# Patient Record
Sex: Female | Born: 1970 | Race: White | Hispanic: No | Marital: Married | Smoking: Current some day smoker
Health system: Southern US, Community
[De-identification: ages and names within clinical notes are randomized; demographics above are authoritative.]

## PROBLEM LIST (undated history)

## (undated) DIAGNOSIS — F419 Anxiety disorder, unspecified: Secondary | ICD-10-CM

## (undated) DIAGNOSIS — F32A Depression, unspecified: Secondary | ICD-10-CM

## (undated) DIAGNOSIS — F329 Major depressive disorder, single episode, unspecified: Secondary | ICD-10-CM

## (undated) HISTORY — DX: Major depressive disorder, single episode, unspecified: F32.9

## (undated) HISTORY — DX: Depression, unspecified: F32.A

## (undated) HISTORY — DX: Anxiety disorder, unspecified: F41.9

---

## 1999-06-24 ENCOUNTER — Inpatient Hospital Stay (HOSPITAL_COMMUNITY): Admission: AD | Admit: 1999-06-24 | Discharge: 1999-07-17 | Payer: Self-pay | Admitting: Obstetrics and Gynecology

## 1999-06-24 ENCOUNTER — Encounter: Payer: Self-pay | Admitting: Obstetrics and Gynecology

## 1999-06-25 ENCOUNTER — Encounter: Payer: Self-pay | Admitting: Obstetrics and Gynecology

## 1999-06-29 ENCOUNTER — Encounter: Payer: Self-pay | Admitting: Obstetrics and Gynecology

## 1999-07-03 ENCOUNTER — Encounter: Payer: Self-pay | Admitting: Obstetrics and Gynecology

## 1999-07-08 ENCOUNTER — Encounter: Payer: Self-pay | Admitting: Obstetrics and Gynecology

## 1999-08-24 ENCOUNTER — Inpatient Hospital Stay (HOSPITAL_COMMUNITY): Admission: AD | Admit: 1999-08-24 | Discharge: 1999-08-24 | Payer: Self-pay | Admitting: *Deleted

## 1999-08-24 ENCOUNTER — Encounter: Payer: Self-pay | Admitting: Obstetrics and Gynecology

## 1999-08-28 ENCOUNTER — Encounter (INDEPENDENT_AMBULATORY_CARE_PROVIDER_SITE_OTHER): Payer: Self-pay

## 1999-08-29 ENCOUNTER — Inpatient Hospital Stay (HOSPITAL_COMMUNITY): Admission: AD | Admit: 1999-08-29 | Discharge: 1999-09-01 | Payer: Self-pay | Admitting: Obstetrics & Gynecology

## 1999-09-02 ENCOUNTER — Encounter (HOSPITAL_COMMUNITY): Admission: RE | Admit: 1999-09-02 | Discharge: 1999-10-03 | Payer: Self-pay | Admitting: Obstetrics & Gynecology

## 1999-10-02 ENCOUNTER — Other Ambulatory Visit: Admission: RE | Admit: 1999-10-02 | Discharge: 1999-10-02 | Payer: Self-pay | Admitting: Obstetrics & Gynecology

## 2000-09-18 ENCOUNTER — Other Ambulatory Visit: Admission: RE | Admit: 2000-09-18 | Discharge: 2000-09-18 | Payer: Self-pay | Admitting: Obstetrics & Gynecology

## 2001-09-23 ENCOUNTER — Other Ambulatory Visit: Admission: RE | Admit: 2001-09-23 | Discharge: 2001-09-23 | Payer: Self-pay | Admitting: Obstetrics & Gynecology

## 2002-09-21 ENCOUNTER — Other Ambulatory Visit: Admission: RE | Admit: 2002-09-21 | Discharge: 2002-09-21 | Payer: Self-pay | Admitting: Gynecology

## 2003-07-28 ENCOUNTER — Ambulatory Visit (HOSPITAL_COMMUNITY): Admission: RE | Admit: 2003-07-28 | Discharge: 2003-07-28 | Payer: Self-pay | Admitting: Obstetrics & Gynecology

## 2003-09-25 ENCOUNTER — Inpatient Hospital Stay (HOSPITAL_COMMUNITY): Admission: AD | Admit: 2003-09-25 | Discharge: 2003-09-25 | Payer: Self-pay | Admitting: Obstetrics and Gynecology

## 2003-10-20 ENCOUNTER — Other Ambulatory Visit: Admission: RE | Admit: 2003-10-20 | Discharge: 2003-10-20 | Payer: Self-pay | Admitting: Obstetrics & Gynecology

## 2004-03-20 ENCOUNTER — Ambulatory Visit (HOSPITAL_COMMUNITY): Admission: AD | Admit: 2004-03-20 | Discharge: 2004-03-20 | Payer: Self-pay | Admitting: Obstetrics & Gynecology

## 2004-06-09 ENCOUNTER — Encounter (INDEPENDENT_AMBULATORY_CARE_PROVIDER_SITE_OTHER): Payer: Self-pay | Admitting: Specialist

## 2004-06-09 ENCOUNTER — Inpatient Hospital Stay (HOSPITAL_COMMUNITY): Admission: RE | Admit: 2004-06-09 | Discharge: 2004-06-12 | Payer: Self-pay | Admitting: Obstetrics & Gynecology

## 2005-05-02 ENCOUNTER — Other Ambulatory Visit: Admission: RE | Admit: 2005-05-02 | Discharge: 2005-05-02 | Payer: Self-pay | Admitting: Obstetrics & Gynecology

## 2006-10-02 ENCOUNTER — Other Ambulatory Visit: Admission: RE | Admit: 2006-10-02 | Discharge: 2006-10-02 | Payer: Self-pay | Admitting: Obstetrics and Gynecology

## 2010-09-01 NOTE — Op Note (Signed)
[name], Sherri NO.:  [id number]   MEDICAL RECORD NO.:  [id number]          PATIENT TYPE:  INP   LOCATION:  9198                          FACILITY:  [facility]   PHYSICIAN:  Ilda Mori, M.D.   DATE OF BIRTH:  Jul 08, 1970   DATE OF PROCEDURE:  06/09/2004  DATE OF DISCHARGE:                                 OPERATIVE REPORT   PREOPERATIVE DIAGNOSES:  1.  Previous cesarean section.  2.  Voluntarily sterilization.   POSTOPERATIVE DIAGNOSES:  1.  Previous cesarean section.  2.  Voluntarily sterilization.   PROCEDURES:  1.  Repeat low transverse cesarean section.  2.  Bilateral partial salpingectomy for sterilization.   SURGEON:  Ilda Mori, M.D.   ASSISTANT:  Randye Lobo, M.D.   ANESTHESIA:  Spinal.   ESTIMATED BLOOD LOSS:  1000 mL.   FINDINGS:  A female infant, Apgar scores 9 and 9.  Birth weight 8 pounds 7  ounces.  Clear amniotic fluid.  Normal-appearing placenta.  Normal-appearing  tubes and ovaries.   INDICATIONS:  This is a 40 year old gravida 2, para 1, with estimated date  of confinement of March 3, who delivered her first baby by cesarean section.  Options were discussed with the patient, who elected to proceed with repeat  cesarean section.  In addition, the patient requested a tubal sterilization.   PROCEDURE:  The patient was taken to the operating room and spinal  anesthesia was placed.  She was then placed in the supine position with left  lateral displacement of the uterus.  The abdomen was prepped and draped in a  sterile fashion and the bladder was catheterized.  A low transverse incision  was made through the previous laparotomy scar.  This was carried down to the  fascia, which was then extended transversely.  The rectus sheath was  dissected free from the rectus muscle superiorly, and the peritoneal cavity  was entered by blunt and sharp dissection and extended bluntly.  The lower  segment was identified and an incision was  made.  The bladder was displaced  inferiorly and the incision was continued down into the endometrial cavity.  The amniotic membranes were ruptured.  The infant was then delivered with  the aid of a Kiwi vacuum extractor without difficulty.  Cord bloods were  obtained.  The placenta was delivered with traction on the cord.  The uterus  was bluntly curettaged, and care was taken to ensure that all membranes were  removed.  The lower segment was then closed with a single layer of running  interlocking 0 Vicryl suture.  The hemostasis was obtained in several areas  with vertical mattress sutures.  After hemostasis was noted to be present,  the attention was turned to the adnexa.  The left tube was identified,  grasped with a Kelly clamp and elevated.  A knuckle of tube was tied at the  base with 0 plain catgut, and this was doubly tied.  The knuckle of tube was  then excised.  An identical procedure was then performed on the  contralateral tube.  Hemostasis was noted to  be present at this point.  The  peritoneum and rectus muscle was closed in the midline with a running 3-0  Vicryl suture.  The fascia was closed with a running 0 Vicryl suture.  The  skin was closed with staples.  The patient tolerated the procedure well and  left the operating room in good condition.      RK/MEDQ  D:  06/09/2004  T:  06/09/2004  Job:  [id number]

## 2010-09-01 NOTE — Discharge Summary (Signed)
[facility]  Patient:    Sherri Navarro, Sherri Navarro                      MRN: [id number] Adm. Date:  [id number] Disc. Date: [id number] Attending:  Lars Pinks Dictator:   Leilani Able, P.A.                           Discharge Summary  FINAL DIAGNOSES:              1. Fetal growth restriction.                               2. Mild preeclampsia.                               3. Breech presentation.  PROCEDURE:                    Primary low transverse cesarean section.  SURGEON:                      Richard D. Arlyce Dice, M.D.  COMPLICATIONS:                None.  HISTORY OF PRESENT ILLNESS:   This 40 year old, gravida 1, para 0, was admitted at 33 weeks 4 days with mild preeclampsia.  The patients prenatal course has been complicated by preterm labor which has been treated with magnesium sulfate in the hospital and then outpatient on oral Terbutaline and oral Procardia.  The patient presented to the office on May 14, with proteinuria on dipstick, increased blood pressure.  The patient had an ultrasound done on May 10 which showed symmetric fetal growth restriction at that time.  The patient was also noted to have a slight elevated ratio on her Doppler flow, but a normal middle cerebral artery Doppler. The patient had a normal AFI and BPP was 8/8.  After amniocentesis was done in he office, LS ratio was 2.3 to 1, but no PG was present.  Platelet count was 105,000 on admission.  HOSPITAL COURSE:              A 12-hour urine was collected that came back with 2 grams of protein in the 12 hours.  Her blood pressures were still elevated in the hospital.  The labs were negative for HELLP syndrome.  After LS ratio showing fetal lung maturity and an ultrasound showing borderline growth restriction and possible abnormalities in the Doppler flow, it was felt to proceed with delivery. Because the baby was in a breech presentation, a cesarean section  was needed to be performed.  The patient was taken to the operating room on Aug 29, 1999, by Caralyn Guile. Arlyce Dice, M.D. where a primary low transverse cesarean section was performed with delivery of a 4 pound 0 ounce female infant with Apgars of 9 and 9.  Delivery went without complications.  The patients postoperative course was benign without significant fevers.  The patients blood pressures started improving on her postoperative course.  She was felt ready for discharge on postoperative day #3. The baby was still stable in the NICU.  She was sent home on a regular diet, told to decrease activities, told to continue prenatal vitamins, and was given Tylox #30 one to two every four hours  as needed for pain and was told to follow up in the  office in four weeks. DD:  09/18/99 TD:  09/20/99 Job: [id number]

## 2010-09-01 NOTE — Discharge Summary (Signed)
[facility]  Patient:    Sherri Navarro, Sherri Navarro                      MRN: [id number] Adm. Date:  [id number] Disc. Date: [id number] Attending:  Conley Simmonds A Dictator:   Leilani Able, P.A.                           Discharge Summary  FINAL DIAGNOSIS:              Intrauterine pregnancy at 24+ weeks gestation.  FINAL DIAGNOSES:              1. Twenty-four-plus-weeks gestation.                               2. Painless vaginal bleeding.                               3. Spontaneous rupture of membranes.                               4. Breech presentation.  HISTORY OF PRESENT ILLNESS:   This 40 year old G1, P0, was admitted at 24 weeks and 1 day secondary to onset of painless vaginal bleeding.  The patient denies any abdominal pain or contractions.  Her blood pressure is slightly elevated upon admission, but she denies any history of hypertension.  HOSPITAL COURSE:              She was admitted at this time.  Laboratories were  obtained, and an OB ultrasound was obtained.  The ultrasound showed an IUP with  breech presentation.  Amniotic fluid was normal.  There was a posterior placenta with a questionable accessory lobe of placenta covering the cervix.  There was o retroplacental clot or bleed.  Cervical length was about 3.9 cm.  The internal s was closed.  The patient was put on bed rest at this time.  A day later, the patient started to notice some leakage of clear fluid from the vagina.  She was  still denying any abdominal pain or contractions.  Did a slide of the fluid, which showed positive ferning and positive nitrazine.  An ultrasound at bedside showed a large pocket of amniotic fluid and still a breech presentation and good fetal movement.  The patient was thought to have spontaneous rupture of membranes. Afebrile at this time, no signs of clear amnionitis.  Her blood pressure started to normalize, and she was continued to be monitored.   The patient was continued at  this time on betamethasone protocol.  Later on June 25, 1999, the patient was started on magnesium sulfate because she was having an occasional contraction. She was followed closely at this time.  The patient was Rh negative and received RhoGAM on June 25, 1999, secondary to her bleeding.  The patient was not having any contractions on her magnesium sulfate.  As of June 26, 1999, the patient was not noticing any more fluid per vagina, and just some scant bleeding.  She was changed from IV penicillin to oral Amoxil and was continued on her magnesium sulfate. n June 27, 1999, the patients magnesium sulfate was able to be stopped.  The patient was having twice-daily non-stress tests at this time.  They were reactive, with no decelerations.  The patient started to have a couple of contractions and was given a shot of subcutaneous terbutaline, and was not having any contractions after this.  She was continued on bed rest.  The patient received another ultrasound on July 03, 1999, which showed a cervix of 1.4 cm long with funneling of the internal os.  At this point the patient was continued on her bed rest. he received a physical therapy consult later on in the week to help with her lower  extremities.  On hospital day #13, the patient was about 26 weeks.  She was doing well.  She had not had any contractions since we started her on her oral terbutaline 2.5 mg 1 every four hours.  We were still observing her at this time. Dr. Gavin Potters was consulted on this patient regarding a cerclage, and they felt that no cerclage would be needed at this time, just to continue her bed rest and terbutaline.  As of the end of March, the patients status was unchanged, and she was stable.  The patient was stable enough to be managed at home on bed rest. he was felt ready for discharge on July 17, 1999.  She was sent home by Dr. Malva Limes.  She was told to  continue her bed rest.  She was continued on terbutaline 2.5 mg 1 every four hours.  Also told to continue her other previous medications, and was to be rechecked in the office in one week.  The patient was also to call if any bleeding occurred or any fluid was noted vaginally. DD:  08/02/99 TD:  08/02/99 Job: [id number]

## 2010-09-01 NOTE — Discharge Summary (Signed)
Sherri Navarro, Sherri Navarro NO.:  [id number]   MEDICAL RECORD NO.:  [id number]          PATIENT TYPE:  INP   LOCATION:  9104                          FACILITY:  [facility]   PHYSICIAN:  Malva Limes, M.D.    DATE OF BIRTH:  27-Jan-1971   DATE OF ADMISSION:  06/09/2004  DATE OF DISCHARGE:  06/12/2004                                 DISCHARGE SUMMARY   DISCHARGE DIAGNOSES:  1.  Intrauterine pregnancy at term.  2.  History of previous cesarean section.  3.  Desires repeat cesarean section and voluntary permanent sterilization.   SURGEON:  Ilda Mori, M.D.   ASSISTANT:  Randye Lobo, M.D.   COMPLICATIONS:  None.   HISTORY OF PRESENT ILLNESS:  This 40 year old gravida 2, para 1, presents at  term for repeat cesarean section and a requested tubal sterilization  procedure.  The patient's first pregnancy was delivered by cesarean section  in 2001 and the patient desires a repeat with this pregnancy.  The patient's  antepartum course up to this point had been complicated by advanced maternal  age.  She declined amniocentesis and any other further testing.  The patient  also had intrauterine insemination to get pregnant with this current  pregnancy.  She is also Rh negative, did receive her RhoGAM at 28 weeks.  The patient had a negative Group B Strep culture obtained in the office at  35 weeks.   HOSPITAL COURSE:  She is admitted at term now for her repeat cesarean  section.  She is taken to the operating room by Dr. Arlyce Dice on June 09, 2004, where a repeat low transverse cesarean section was performed with the  delivery of an 8 pound 7 ounce female infant with Apgars of 9 and 9.  Delivery  went without complications.  The patient still expressed her desire for  permanent sterilization which was performed using a bilateral partial  salpingectomy.  The procedure went without complications.  The patient's  postoperative course was benign without significant fevers.  She  was thought  ready for discharge on postoperative day #3.  She was sent home on a regular  diet, told to decrease activities, told to continue prenatal vitamins.  She  was given Darvocet-N 100 one to two every 4 hours as needed for pain with  follow-up in the office in 4 weeks.   DISCHARGE LABORATORY DATA:  The patient had a hemoglobin of 11.6, white  blood cell count 7.4, and platelets of 110,000.      MB/MEDQ  D:  07/10/2004  T:  07/10/2004  Job:  [id number]

## 2010-09-01 NOTE — Op Note (Signed)
[facility]  Patient:    Sherri Navarro, Sherri Navarro                      MRN: [id number] Proc. Date: 08/29/99 Adm. Date:  [id number] Attending:  Lars Pinks                           Operative Report  PREOPERATIVE DIAGNOSIS:       Fetal growth restriction, mild preeclampsia, and breech presentation.  POSTOPERATIVE DIAGNOSIS:      Fetal growth restriction, mild preeclampsia, and breech presentation.  OPERATION:                    Primary low transverse cesarean section.  SURGEON:                      Richard D. Arlyce Dice, M.D.  ASSISTANT:  ANESTHESIA:                   Spinal.  ESTIMATED BLOOD LOSS:         500 cc.  FINDINGS:                     A female infant, Apgars 9 and 9, birth weight 4 pounds 0 ounces, normal appearing tubes, ovaries, and uterus.  Clear amniotic fluid.  INDICATIONS:                  This is a 40 year old primigravida who was admitted at 33 weeks 4 days with mild preeclampsia.  The patients prenatal course has been complicated by preterm labor which had been treated with magnesium sulfate and outpatient on oral Terbutaline and oral Nifedipine.  The patient presented to the office on May 14, with proteinuria on urine dipstick and increased blood pressure to 170/100.  The patient had had an ultrasound done on May 10, which showed symmetric fetal growth restriction, approximately 12 days behind a previous ultrasound.  She also was noted to have a slight elevated ratio on her Doppler low study, but a normal middle cerebral artery Doppler.  She had normal amniotic fluid volume and a biophysical profile was 8 over 8.  An amniocentesis was done in the office and on the morning of the second hospital day, the LS ratio was 2.3 to 1, but no PG was present.  Platelet count was 105,000 on admission and 108,000 the  following morning.  A 12-hour urine was collected and the result came back 2 grams in 12 hours which would be  approximately 4 grams in 24 hours.  Her blood pressures in the hospital were in the 150/90 range.  Her pregnancy-induced hypertension laboratories were all negative for HELLP syndrome or other significant signs of  severe toxemia.  The assessment was made that with an LS ratio showing fetal lung maturity with an ultrasound that showed a borderline growth restriction and possible abnormalities in the Doppler flow and with significant proteinuria and  mild to moderate hypertension that the delivery should be effected.  Because the baby was in the breech presentation, the decision was made to proceed with cesarean section.  DESCRIPTION OF PROCEDURE:     The patient was brought to the operating room and a spinal anesthesia was placed.  The abdomen was prepped and draped in the usual sterile fashion.  The bladder was catheterized.  A low transverse incision was ade and carried down to  the fascia.  The fascia was extended transversely.  The rectus muscle was dissected from the overlying rectus sheath and then divided in the midline.  The peritoneum was then entered sharply and extended vertically.  The  lower segment was identified.  An incision was made in the lower segment and then this incision was extended with bandage scissors in a transverse fashion.  The membranes were ruptured.  A footling breech was noted.  The feet were delivered and then the baby was delivered by total breech extraction.  Cord bloods were obtained. The placenta was then delivered with traction on the cord.  The uterus was bluntly curettaged.  The lower segment was closed in a single layer of running interlocking Vicryl 1 suture.  Hemostasis was obtained with vertical mattress sutures in the  incision.  The bladder plane was not reopposed.  The muscle and peritoneum was closed in a single layer.  The fascia was closed with a running Vicryl 1 suture and the skin was closed with staples.  The patient  tolerated the procedure well and  left the operating room in good condition. DD:  08/29/99 TD:  08/30/99 Job: [id number]

## 2013-02-19 ENCOUNTER — Other Ambulatory Visit: Payer: Self-pay

## 2013-11-17 ENCOUNTER — Other Ambulatory Visit: Payer: Self-pay

## 2013-11-18 LAB — CYTOLOGY - PAP

## 2013-11-23 ENCOUNTER — Other Ambulatory Visit: Payer: Self-pay | Admitting: Obstetrics & Gynecology

## 2013-11-23 DIAGNOSIS — R928 Other abnormal and inconclusive findings on diagnostic imaging of breast: Secondary | ICD-10-CM

## 2013-11-26 ENCOUNTER — Ambulatory Visit
Admission: RE | Admit: 2013-11-26 | Discharge: 2013-11-26 | Disposition: A | Discharge status: Home / Self Care | Payer: Self-pay | Source: Ambulatory Visit | Attending: Obstetrics & Gynecology | Admitting: Obstetrics & Gynecology

## 2013-11-26 DIAGNOSIS — R928 Other abnormal and inconclusive findings on diagnostic imaging of breast: Secondary | ICD-10-CM

## 2014-02-01 ENCOUNTER — Other Ambulatory Visit: Payer: Self-pay | Admitting: Obstetrics & Gynecology

## 2014-02-01 DIAGNOSIS — N6002 Solitary cyst of left breast: Secondary | ICD-10-CM

## 2015-04-05 ENCOUNTER — Ambulatory Visit (INDEPENDENT_AMBULATORY_CARE_PROVIDER_SITE_OTHER): Payer: BLUE CROSS/BLUE SHIELD | Admitting: Licensed Clinical Social Worker

## 2015-04-05 DIAGNOSIS — F3341 Major depressive disorder, recurrent, in partial remission: Secondary | ICD-10-CM | POA: Diagnosis not present

## 2015-04-20 ENCOUNTER — Ambulatory Visit (INDEPENDENT_AMBULATORY_CARE_PROVIDER_SITE_OTHER): Payer: Self-pay | Admitting: Neurology

## 2015-04-20 ENCOUNTER — Ambulatory Visit (INDEPENDENT_AMBULATORY_CARE_PROVIDER_SITE_OTHER): Payer: BLUE CROSS/BLUE SHIELD | Admitting: Neurology

## 2015-04-20 DIAGNOSIS — G540 Brachial plexus disorders: Secondary | ICD-10-CM

## 2015-04-20 DIAGNOSIS — R29898 Other symptoms and signs involving the musculoskeletal system: Secondary | ICD-10-CM

## 2015-04-20 DIAGNOSIS — Z0289 Encounter for other administrative examinations: Secondary | ICD-10-CM

## 2015-04-20 NOTE — Progress Notes (Signed)
  [facility]    Provider:  Dr Lucia GaskinsAhern Referring Provider: Pati GalloKramer, James, MD Primary Care Physician:  Neldon LabellaMILLER,LISA LYNN, MD  History:  Sherri Navarro is a 45 y.o. female here as a referral from Dr. Farris HasKramer for weakness and diffuse pain in the right shoulder girdle. Symptoms started 8 years ago with tingling in the wrist. Then shoulder pain with decreased ROM followed by weakness. Tingling has resolved. She still complains of weakness and limited ROM. Right scapular winging.   Summary: Nerve Conduction studies were performed on the bilateral upper extremities.  ADM Ulnar and APB Median motor conductions were within normal limits with normal F wave latencies.  2nd-digit Median, 5th-digit Ulnar and Radial sensory conductions were within normal limits.   EMG needle evaluation was performed on selected right upper extremity muscles: The right Infraspinatus, right Supraspinatus, right Serratus Anterior, right Rhomboids, right Deltoid, right Biceps, right Triceps, right Brachioradialis, right Extensor indicis, right Extensor Digitorum Communis, right Flexor Digitorum Profundus (ulnar), right Pronator Teres, right First Dorsal Interoseous, right Opponens Pollicis and right C7/C8 paraspinal muscles were within normal limits.   Conclusion:  No electrophysiologic evidence for median or ulnar neuropathy, radiculopathy or acute/ongoing brachial plexitis of the right arm.  However limited comparison to muscles of the left upper extremity extremity showed differences in patterns of recruitment. 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     Assessment/Plan:    Naomie DeanAntonia Aerica Rincon, MD  [facility] [address] [place], [place] [id number]  Phone [phone] Fax [phone]

## 2015-04-20 NOTE — Progress Notes (Signed)
See procedure note.

## 2015-04-21 NOTE — Procedures (Signed)
  [facility]    Provider:  Dr Lucia GaskinsAhern Referring Provider: Pati GalloKramer, James, MD Primary Care Physician:  Neldon LabellaMILLER,LISA LYNN, MD  History:  Sherri Navarro is a 45 y.o. female here as a referral from Dr. Farris HasKramer for weakness and diffuse pain in the right shoulder girdle. Symptoms started 8 years ago with tingling in the wrist. Then shoulder pain with decreased ROM followed by weakness. Tingling has resolved. She still complains of weakness and limited ROM. Right scapular winging.   Summary: Nerve Conduction studies were performed on the bilateral upper extremities.  ADM Ulnar and APB Median motor conductions were within normal limits with normal F wave latencies.  2nd-digit Median, 5th-digit Ulnar and Radial sensory conductions were within normal limits.   EMG needle evaluation was performed on selected right upper extremity muscles: The right Infraspinatus, right Supraspinatus, right Serratus Anterior, right Rhomboids, right Deltoid, right Biceps, right Triceps, right Brachioradialis, right Extensor indicis, right Extensor Digitorum Communis, right Flexor Digitorum Profundus (ulnar), right Pronator Teres, right First Dorsal Interoseous, right Opponens Pollicis and right C7/C8 paraspinal muscles were within normal limits.   Conclusion:  No electrophysiologic evidence for median or ulnar neuropathy, radiculopathy or acute/ongoing brachial plexitis of the right arm.  However limited comparison to muscles of the left upper extremity showed differences in patterns of recruitment. 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     Sherri DeanAntonia Samba Cumba, MD  [facility] [address] [place], [place] [id number]  Phone [phone] Fax [phone]

## 2015-04-25 ENCOUNTER — Ambulatory Visit: Payer: BLUE CROSS/BLUE SHIELD | Admitting: Licensed Clinical Social Worker

## 2015-04-26 ENCOUNTER — Ambulatory Visit (INDEPENDENT_AMBULATORY_CARE_PROVIDER_SITE_OTHER): Payer: BLUE CROSS/BLUE SHIELD | Admitting: Licensed Clinical Social Worker

## 2015-04-26 DIAGNOSIS — F3341 Major depressive disorder, recurrent, in partial remission: Secondary | ICD-10-CM

## 2015-05-10 ENCOUNTER — Ambulatory Visit (INDEPENDENT_AMBULATORY_CARE_PROVIDER_SITE_OTHER): Payer: BLUE CROSS/BLUE SHIELD | Admitting: Licensed Clinical Social Worker

## 2015-05-10 DIAGNOSIS — F3341 Major depressive disorder, recurrent, in partial remission: Secondary | ICD-10-CM | POA: Diagnosis not present

## 2015-05-24 ENCOUNTER — Ambulatory Visit (INDEPENDENT_AMBULATORY_CARE_PROVIDER_SITE_OTHER): Payer: BLUE CROSS/BLUE SHIELD | Admitting: Licensed Clinical Social Worker

## 2015-05-24 DIAGNOSIS — F3341 Major depressive disorder, recurrent, in partial remission: Secondary | ICD-10-CM

## 2015-06-07 ENCOUNTER — Encounter: Payer: Self-pay | Admitting: Neurology

## 2015-06-07 ENCOUNTER — Ambulatory Visit (INDEPENDENT_AMBULATORY_CARE_PROVIDER_SITE_OTHER): Payer: BLUE CROSS/BLUE SHIELD | Admitting: Neurology

## 2015-06-07 VITALS — BP 118/73 | HR 68 | Ht 65.0 in | Wt 144.0 lb

## 2015-06-07 DIAGNOSIS — G540 Brachial plexus disorders: Secondary | ICD-10-CM | POA: Diagnosis not present

## 2015-06-07 DIAGNOSIS — R29898 Other symptoms and signs involving the musculoskeletal system: Secondary | ICD-10-CM | POA: Diagnosis not present

## 2015-06-07 NOTE — Progress Notes (Signed)
[facility]    Provider:  Dr Lucia Gaskins Referring Provider: Sigmund Hazel, MD Primary Care Physician:  Neldon Labella, MD  CC:  Right arm weakness  HPI:  Sherri Navarro is a 45 y.o. female here as a referral from Dr. Hyacinth Meeker for right arm pain. It started about 8 years ago in the right arm and wrist with pain. She had never had shoulder issues previously. She had tingling in the right wrist then pain in the shoulder, It was nagging, pulling pain, couldn't find a comfortable place for her arm, constant, no inciting factors, no trauma, no previously illnesses as far as she can remember. Unclear when the scapular winging happened. She was going to the chiropractor, adjustments helped, the pain was severe for a month and then got better and lasted 3-4 months and progressively got better. Slowly the weakness occurred and she has been weak since that time. Range of motion decreased at that time as well. Pain resolved with residual weakness since then. She has never had imaging of the neck or the brachial plexus. She has no current neck pain. She went to the Orthopaedist. She has been experiencing similar pain recently. She feels extremely weak in the right arm and she has discomfort like a mild pulling. No fevers or systemic signs. No new sensory symptoms. Unclear new weakness, just pain. No recent illnesses or trauma. She works out a lot.   Reviewed notes, labs and imaging from outside physicians, which showed: EMG/NCS on the upper extremities showed:   Summary: Nerve Conduction studies were performed on the bilateral upper extremities.  ADM Ulnar and APB Median motor conductions were within normal limits with normal F wave latencies.  2nd-digit Median, 5th-digit Ulnar and Radial sensory conductions were within normal limits.   EMG needle evaluation was performed on selected right upper extremity muscles: The right Infraspinatus, right Supraspinatus, right Serratus Anterior, right  Rhomboids, right Deltoid, right Biceps, right Triceps, right Brachioradialis, right Extensor indicis, right Extensor Digitorum Communis, right Flexor Digitorum Profundus (ulnar), right Pronator Teres, right First Dorsal Interoseous, right Opponens Pollicis and right C7/C8 paraspinal muscles were within normal limits.   Conclusion: No electrophysiologic evidence for median or ulnar neuropathy, radiculopathy or acute/ongoing brachial plexitis of the right arm. However limited comparison to muscles of the left upper extremity showed differences in patterns of recruitment. 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   Review of Systems: Patient complains of symptoms per HPI as well as the following symptoms: joint pain, numbness, weakness, insomnia, depression, anxiety, not enough sleep. Pertinent negatives per HPI. All others negative.   Social History   Social History  . Marital Status: Married    Spouse Name: Loraine Leriche   . Number of Children: 2  . Years of Education: 16   Occupational History  . [employer]    Social History Main Topics  . Smoking status: Current Some Day Smoker -- 0.25 packs/day    Types: Cigarettes  . Smokeless tobacco: Not on file  . Alcohol Use: No     Comment: rare  . Drug Use: No  . Sexual Activity: Not on file   Other Topics Concern  . Not on file   Social History Narrative   Lives with husband and 2 children   Caffeine use: 2 cups coffee/day.    Family History  Problem Relation Age of  Onset  . Hypertension Mother   . Diabetes Mother   . Heart disease Father   . Heart disease Paternal Grandfather   . Other Father     back problems  . Other Sister     back problems  . Urolithiasis Mother     Past Medical History  Diagnosis Date  . Depression   . Anxiety     Past Surgical History  Procedure  Laterality Date  . Cesarean section  08/29/1999 & 06/09/2004    x2    Current Outpatient Prescriptions  Medication Sig Dispense Refill  . citalopram (CELEXA) 20 MG tablet Take 20 mg by mouth daily.  3  . FERROUS SULFATE PO Take 1 tablet by mouth daily.     No current facility-administered medications for this visit.    Allergies as of 06/07/2015 - Review Complete 06/07/2015  Allergen Reaction Noted  . Aspirin  06/07/2015    Vitals: BP 118/73 mmHg  Pulse 68  Ht  (1.651 m)  Wt 144 lb (65.318 kg)  BMI 23.96 kg/m2 Last Weight:  Wt Readings from Last 1 Encounters:  06/07/15 144 lb (65.318 kg)   Last Height:   Ht Readings from Last 1 Encounters:  06/07/15  (1.651 m)   Physical exam: Exam: Gen: NAD, conversant                  Eyes: Conjunctivae clear without exudates or hemorrhage  Neuro: Detailed Neurologic Exam  Speech:    Speech is normal; fluent and spontaneous with normal comprehension.  Cognition:    The patient is oriented to person, place, and time;     recent and remote memory intact;     language fluent;     normal attention, concentration,     fund of knowledge Cranial Nerves:    The pupils are equal, round, and reactive to light. Visual fields are full to finger confrontation. Extraocular movements are intact. Trigeminal sensation is intact and the muscles of mastication are normal. The face is symmetric. The palate elevates in the midline. Hearing intact. Voice is normal. Shoulder shrug is normal. The tongue has normal motion without fasciculations.   Coordination:    Normal finger to nose and heel to shin. Normal rapid alternating movements.   Gait:    Heel-toe and tandem gait are normal.   Motor Observation:    No asymmetry, no atrophy, and no involuntary movements noted.   Tone:    Normal muscle tone.    Posture:    Posture is normal. normal erect    Strength: Right delt 4+/5 and Triceps 4+/5 otherwise strength is V/V in the upper  and lower limbs.      Sensation: intact to LT     Reflex Exam:  DTR's:    Deep tendon reflexes in the upper and lower extremities are normal bilaterally.   Toes:    The toes are downgoing bilaterally.   Clonus:    Clonus is absent.       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 She has never had imaging of her cervical spine or brachial plexus. Symptoms may be recurring. Will image the Brachial Plexus and then image the cervical spine if needed. Discussed the emg/ncs results with patient.   CC: Dr. Tamera Punt, MD  [facility] [address] [place], [place] [address]  Phone [phone] Fax [phone]  A total of 40 minutes was spent face-to-face with this patient. Over half this time was spent on counseling patient on the remote brachial plexus injury diagnosis and different diagnostic and therapeutic options available.

## 2015-06-07 NOTE — Patient Instructions (Addendum)
Remember to drink plenty of fluid, eat healthy meals and do not skip any meals. Try to eat protein with a every meal and eat a healthy snack such as fruit or nuts in between meals. Try to keep a regular sleep-wake schedule and try to exercise daily, particularly in the form of walking, 20-30 minutes a day, if you can.   As far as diagnostic testing: MRI of the chest to examine the brachial plexus, physical therapy, lab  I would like to see you back after physical therapy, sooner if we need to. Please call us with any interim questions, concerns, problems, updates or refill requests.   Our phone number is [phone]. We also have an after hours call service for urgent matters and there is a physician on-call for urgent questions. For any emergencies you know to call 911 or go to the nearest emergency room

## 2015-06-08 ENCOUNTER — Telehealth: Payer: Self-pay | Admitting: *Deleted

## 2015-06-08 LAB — BASIC METABOLIC PANEL
BUN/Creatinine Ratio: 21 (ref 9–23)
BUN: 12 mg/dL (ref 6–24)
CALCIUM: 8.9 mg/dL (ref 8.7–10.2)
CO2: 24 mmol/L (ref 18–29)
CREATININE: 0.57 mg/dL (ref 0.57–1.00)
Chloride: 99 mmol/L (ref 96–106)
GFR, EST AFRICAN AMERICAN: 129 mL/min/{1.73_m2} (ref >59)
GFR, EST NON AFRICAN AMERICAN: 112 mL/min/{1.73_m2} (ref >59)
Glucose: 86 mg/dL (ref 65–99)
Potassium: 4.5 mmol/L (ref 3.5–5.2)
Sodium: 137 mmol/L (ref 134–144)

## 2015-06-08 NOTE — Telephone Encounter (Signed)
LVM for pt to call about results. Gave [facility] phone number. Ok to inform pt labs normal per Dr Ahern. 

## 2015-06-08 NOTE — Telephone Encounter (Signed)
-----   Message from Anson Fret, MD sent at 06/08/2015  7:55 AM EST ----- Labs normal

## 2015-06-12 DIAGNOSIS — G540 Brachial plexus disorders: Secondary | ICD-10-CM | POA: Insufficient documentation

## 2015-06-14 ENCOUNTER — Ambulatory Visit (INDEPENDENT_AMBULATORY_CARE_PROVIDER_SITE_OTHER): Payer: BLUE CROSS/BLUE SHIELD | Admitting: Licensed Clinical Social Worker

## 2015-06-14 DIAGNOSIS — F3341 Major depressive disorder, recurrent, in partial remission: Secondary | ICD-10-CM | POA: Diagnosis not present

## 2015-06-16 NOTE — Telephone Encounter (Signed)
Called pt back. Advised labs normal per Dr Lucia Gaskins. She verbalized understanding.

## 2015-06-20 ENCOUNTER — Telehealth: Payer: Self-pay | Admitting: Neurology

## 2015-06-20 ENCOUNTER — Inpatient Hospital Stay: Admission: RE | Admit: 2015-06-20 | Payer: Self-pay | Source: Ambulatory Visit

## 2015-06-20 NOTE — Telephone Encounter (Signed)
Pt wanted to let Dr. Lucia GaskinsAhern know that she has cancelled her MRI because pt was to pay $1,000 out of pocket. She is going to start pt though. Pt just wanted to give an update. May call pt at [phone], cell

## 2015-07-05 ENCOUNTER — Telehealth: Payer: Self-pay | Admitting: Neurology

## 2015-07-05 ENCOUNTER — Ambulatory Visit (INDEPENDENT_AMBULATORY_CARE_PROVIDER_SITE_OTHER): Payer: BLUE CROSS/BLUE SHIELD | Admitting: Licensed Clinical Social Worker

## 2015-07-05 DIAGNOSIS — G540 Brachial plexus disorders: Secondary | ICD-10-CM

## 2015-07-05 DIAGNOSIS — F3341 Major depressive disorder, recurrent, in partial remission: Secondary | ICD-10-CM | POA: Diagnosis not present

## 2015-07-05 DIAGNOSIS — R29898 Other symptoms and signs involving the musculoskeletal system: Secondary | ICD-10-CM

## 2015-07-05 NOTE — Telephone Encounter (Signed)
Dr Lucia GaskinsAhern- FYI  Called Angie back. She stated dx for brachial plexopathy would be addressed by OT and not PT. They would address right arm weakness as well. I placed new referral as requested.

## 2015-07-05 NOTE — Telephone Encounter (Signed)
Angie/Neuro Rehab [phone] called to request change in referral for PT. Diagnosis given is an OT diagnosis, referral needs to be changed to OT.

## 2015-07-06 ENCOUNTER — Ambulatory Visit: Payer: BLUE CROSS/BLUE SHIELD | Attending: Neurology | Admitting: Occupational Therapy

## 2015-07-06 ENCOUNTER — Encounter: Payer: Self-pay | Admitting: Occupational Therapy

## 2015-07-06 DIAGNOSIS — M2560 Stiffness of unspecified joint, not elsewhere classified: Secondary | ICD-10-CM | POA: Diagnosis present

## 2015-07-06 DIAGNOSIS — R29898 Other symptoms and signs involving the musculoskeletal system: Secondary | ICD-10-CM | POA: Diagnosis present

## 2015-07-06 NOTE — Therapy (Signed)
[facility] [address] [place], [place], [address] Phone: [phone]   Fax:  [phone]  Occupational Therapy Evaluation  Patient Details  Name: Sherri Navarro MRN: [id number] Date of Birth: January 19, 1971 Referring Provider: Dr. Naomie Dean  Encounter Date: 07/06/2015      OT End of Session - 07/06/15 1349    Visit Number 1   Number of Visits 8   Date for OT Re-Evaluation 09/03/15   Authorization Type BCBS (30 VISIT LIMIT)    OT Start Time 1232   OT Stop Time 1318   OT Time Calculation (min) 46 min   Activity Tolerance Patient tolerated treatment well      Past Medical History  Diagnosis Date  . Depression   . Anxiety     Past Surgical History  Procedure Laterality Date  . Cesarean section  08/29/1999 & 06/09/2004    x2    There were no vitals filed for this visit.  Visit Diagnosis:  Weakness of shoulder - Plan: Ot plan of care cert/re-cert  Limited joint range of motion (ROM) - Plan: Ot plan of care cert/re-cert      Subjective Assessment - 07/06/15 1238    Subjective  The chiropractor helped with the pain   Pertinent History unknown etiology    Patient Stated Goals to improve ROM and strength Rt shoulder   Currently in Pain? 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 Naomie Dean   Onset Date --  8 years ago, 1 month ago pain returned   Assessment Bilateral scapula winging noted Rt > Lt. Pt also appears weaker during MMT with ER, Deltoid motion (sh. flex and abd), bilaterally Rt worse than Lt, and biceps RUE. Due to bilateral involvement, ? more cervical (C5-6 mostly) vs. a true brachial plexus involvement.    Prior Therapy chiropractor only   Precautions   Precautions None  however therapist instructed pt NOT to  perform certain mvmts   Restrictions   Weight Bearing Restrictions No   Balance Screen   Has the patient fallen in the past 6 months No   Has the patient had a decrease in activity level because of a fear of falling?  No   Is the patient reluctant to leave their home because of a fear of falling?  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 Pt reports difficulty getting certain shirts on/off if fitted but otherwise independent with dressing. Independent with bathing. Independent with meal prep and day-to-day cleaning. Pt has someone come for heavier cleaning. Pt independent with financial management and driving.    Mobility   Mobility Status Independent   Written Expression   Dominant Hand Right   Handwriting 100% legible   Vision - History   Baseline Vision Wears contact   Additional Comments denies changes   Sensation   Light Touch Appears Intact   Additional Comments  denies change b/t RT and LT UE's   Coordination   9 Hole Peg Test Right;Left   Right 9 Hole Peg Test 18.82 sec.    Left 9 Hole Peg Test 21.16 sec   Coordination Noted ulnar, median, and radial n. movements appear intact bilateral hands.    Edema   Edema none   ROM / Strength   AROM / PROM / Strength AROM;Strength   AROM   Overall AROM Comments RUE: sh flex = 90*, abduction = 80*, ER approx. 75%. Elbow distally WNL's. LUE sh flex = 140*, abd (scaption) = 135*. Elbow distally WNL's   Strength   Overall Strength Comments RUE: shoulder flex and abduction 3+/5 (within available range), IR 4/5, ER 3+/5. LUE sh. flex, abd, IR 4/5 (within available range), ER 3+/5. RUE Elbow flex 4/5, ext 5/5 with compensation at  shoulder and winging. RUE forearm distally to hand and LUE elbow and distally 5/5 grossly.    Hand Function   Right Hand Grip (lbs) 56 lbs   Left Hand Grip (lbs) 61 lbs                         OT Education - 07/06/15 1350    Education provided Yes   Education Details Presentation of symptoms, what motions to avoid at gym   Person(s) Educated Patient   Methods Explanation   Comprehension Verbalized understanding             OT Long Term Goals - 07/06/15 1357    OT LONG TERM GOAL #1   Title Pt independent with HEP for scapula stabalization and BUE's (due by 09/03/15)   Time 8   Period Weeks   Status New   OT LONG TERM GOAL #2   Title Pt to report increased ease getting pull over shirts on/off overhead   Time 8   Period Weeks   Status New   OT LONG TERM GOAL #3   Title Pt to verbalize understanding of techniques to maintain shoulder integrity and positions to avoid    Time 8   Period Weeks   Status New               Plan - 07/06/15 1352    Clinical Impression Statement Pt is a 45 y.o. female who presents to outpatient rehab with diagnosis of remote brachial plexus injury causing Rt arm weakness and pain. Upon assessment, noted pt having bilateral symptoms, Rt > Lt and scapula winging bilaterally. Pt reports this began approx. 8 years ago in RUE and got better after seeing chiropractor, but has gradually gotten worse over the last month. Pt has begun seeing chiropractor again which has helped with pain. ? cervical involvement (C5-6) due to bilateral presentation.    Pt will benefit from skilled therapeutic intervention in order to improve on the following deficits (Retired) Decreased endurance;Decreased activity tolerance;Decreased knowledge of precautions;Impaired UE functional use;Pain;Decreased strength   Rehab Potential Good   Clinical Impairments Affecting Rehab Potential time since onset, ? etiology and presentation   OT Frequency 1x / week  Pt  wishes to come only 1x/wk due to high co-pay   OT Duration 8 weeks   OT Treatment/Interventions Self-care/ADL training;Therapeutic exercise;Patient/family education;Neuromuscular education;Manual Therapy;DME and/or AE instruction;Therapeutic activities;Electrical Stimulation;Moist Heat;Passive range of motion   Plan scapula stabilization ex's   Consulted and Agree with Plan of Care Patient        Problem List Patient Active Problem List   Diagnosis  Date Noted  . Brachial plexopathy 06/12/2015    Kelli ChurnBallie, Chanee Henrickson Johnson, OTR/L 07/06/2015, 2:01 PM  [facility] [address] [place], [place], [address] Phone: [phone]   Fax:  [phone]  Name: Sherri Navarro MRN: [id number] Date of Birth: 08/10/1970

## 2015-07-07 ENCOUNTER — Ambulatory Visit: Payer: BLUE CROSS/BLUE SHIELD | Admitting: Physical Therapy

## 2015-08-02 ENCOUNTER — Ambulatory Visit: Payer: BLUE CROSS/BLUE SHIELD | Attending: Neurology | Admitting: Occupational Therapy

## 2015-08-02 ENCOUNTER — Ambulatory Visit (INDEPENDENT_AMBULATORY_CARE_PROVIDER_SITE_OTHER): Payer: BLUE CROSS/BLUE SHIELD | Admitting: Licensed Clinical Social Worker

## 2015-08-02 ENCOUNTER — Encounter: Payer: Self-pay | Admitting: Occupational Therapy

## 2015-08-02 DIAGNOSIS — R29898 Other symptoms and signs involving the musculoskeletal system: Secondary | ICD-10-CM

## 2015-08-02 DIAGNOSIS — F3341 Major depressive disorder, recurrent, in partial remission: Secondary | ICD-10-CM | POA: Diagnosis not present

## 2015-08-02 DIAGNOSIS — M25611 Stiffness of right shoulder, not elsewhere classified: Secondary | ICD-10-CM | POA: Insufficient documentation

## 2015-08-02 DIAGNOSIS — R293 Abnormal posture: Secondary | ICD-10-CM | POA: Diagnosis present

## 2015-08-02 DIAGNOSIS — M6281 Muscle weakness (generalized): Secondary | ICD-10-CM | POA: Insufficient documentation

## 2015-08-02 NOTE — Therapy (Signed)
[facility] [address] [place], [place], [address] Phone: [phone]   Fax:  [phone]  Occupational Therapy Treatment  Patient Details  Name: Sherri Navarro MRN: [id number] Date of Birth: [birth year] Referring Provider: Dr. Naomie Dean  Encounter Date: 08/02/2015      OT End of Session - 08/02/15 1021    Visit Number 2   Number of Visits 8   Date for OT Re-Evaluation 09/24/15   Authorization Type BCBS (30 VISIT LIMIT)    OT Start Time 0930   OT Stop Time 1015   OT Time Calculation (min) 45 min   Activity Tolerance Patient tolerated treatment well      Past Medical History  Diagnosis Date  . Depression   . Anxiety     Past Surgical History  Procedure Laterality Date  . Cesarean section  08/29/1999 & 06/09/2004    x2    There were no vitals filed for this visit.      Subjective Assessment - 08/02/15 0938    Subjective  I was on vacation to [place]    Pertinent History unknown etiology    Patient Stated Goals to improve ROM and strength Rt shoulder   Currently in Pain? No/denies                      OT Treatments/Exercises (OP) - 08/02/15 0001    Neurological Re-education Exercises   Scapular Stabilization Bilateral  see pt instructions and HEP                OT Education - 08/02/15 1015    Education provided Yes   Education Details Scapula stabilization HEP   Person(s) Educated Patient   Methods Explanation;Demonstration;Handout   Comprehension Verbalized understanding;Returned demonstration             OT Long Term Goals - 07/06/15 1357    OT LONG TERM GOAL #1   Title Pt independent with HEP for scapula stabalization and BUE's (due by 09/03/15)   Time 8   Period Weeks   Status New   OT LONG TERM GOAL #2   Title Pt to report increased ease getting pull over shirts on/off overhead   Time 8   Period Weeks   Status New   OT LONG TERM GOAL #3   Title Pt  to verbalize understanding of techniques to maintain shoulder integrity and positions to avoid    Time 8   Period Weeks   Status New               Plan - 08/02/15 1022    Clinical Impression Statement Pt returns today after initial evaluation and tolerating initial scapula ex's well today   Clinical Impairments Affecting Rehab Potential time since onset, ? etiology and presentation   OT Frequency 1x / week   OT Treatment/Interventions Self-care/ADL training;Therapeutic exercise;Patient/family education;Neuromuscular education;Manual Therapy;DME and/or AE instruction;Therapeutic activities;Electrical Stimulation;Moist Heat;Passive range of motion   Plan continue scapula stabalization   Consulted and Agree with Plan of Care Patient      Patient will benefit from skilled therapeutic intervention in order to improve the following deficits and impairments:  Decreased endurance, Decreased activity tolerance, Decreased knowledge of precautions, Impaired UE functional use, Pain, Decreased strength  Visit Diagnosis: Other symptoms and signs involving the musculoskeletal system - Plan: Ot plan of care cert/re-cert  Muscle weakness (generalized) - Plan: Ot plan of care cert/re-cert  Abnormal posture - Plan: Ot plan of  care cert/re-cert  Stiffness of right shoulder, not elsewhere classified - Plan: Ot plan of care cert/re-cert    Problem List Patient Active Problem List   Diagnosis Date Noted  . Brachial plexopathy 06/12/2015    Kelli ChurnBallie, Yania Bogie Johnson, OTR/L 08/02/2015, 10:39 AM  [facility] [address] [place], [place], [address] Phone: [phone]   Fax:  [phone]  Name: Su Hiltndrea S Wiker MRN: [id number] Date of Birth: 06/23/1970

## 2015-08-02 NOTE — Patient Instructions (Addendum)
   SCAPULA STRENGTHENING/STABALIZATION EXERCISES: Do all 2x/day x 10 reps each both arms  1) Lying on back: hold 2 lb. Weight in 90 degrees shoulder flexion, perform small circles clockwise and counter clockwise (do Rt arm, then Lt arm)  2) Roll over on stomach, arms down by side, pinch shoulder blades together (do not hike shoulders up towards ears) and hold 3-5 sec. Then repeat in airplane position lifting arms 2" off floor (do NOT hike shoulders up)  3) On arms and knees - cat/cow stretch (arching back one way and then the other), then slowly rock forward slightly  4) Chair push ups - concentrate on shoulder blades going down, go slow  5) Wall push ups at corner, arms out to side  6) With theraband facing door:  "Rows" (bent arm with forearms parallel to floor) - do both arms together  7) With theraband facing door: Pull arms straight back (start 45 degrees in front, pull back to hips) - do both arms together

## 2015-08-09 ENCOUNTER — Ambulatory Visit: Payer: BLUE CROSS/BLUE SHIELD | Admitting: Occupational Therapy

## 2015-08-16 ENCOUNTER — Encounter: Payer: Self-pay | Admitting: Occupational Therapy

## 2015-08-23 ENCOUNTER — Encounter: Payer: Self-pay | Admitting: Occupational Therapy

## 2015-08-23 ENCOUNTER — Ambulatory Visit: Payer: BLUE CROSS/BLUE SHIELD | Attending: Neurology | Admitting: Occupational Therapy

## 2015-08-23 DIAGNOSIS — M6281 Muscle weakness (generalized): Secondary | ICD-10-CM | POA: Diagnosis present

## 2015-08-23 DIAGNOSIS — R29898 Other symptoms and signs involving the musculoskeletal system: Secondary | ICD-10-CM

## 2015-08-23 DIAGNOSIS — R293 Abnormal posture: Secondary | ICD-10-CM

## 2015-08-23 NOTE — Therapy (Signed)
[facility] [address] [place], [place], [address] Phone: [phone]   Fax:  [phone]  Occupational Therapy Treatment  Patient Details  Name: Sherri Navarro MRN: [id number] Date of Birth: 08/18/1970 Referring Provider: Dr. Naomie DeanAntonia Ahern  Encounter Date: 08/23/2015      OT End of Session - 08/23/15 1027    Visit Number 3   Number of Visits 8   Authorization Type BCBS (30 VISIT LIMIT)    OT Start Time 0930   OT Stop Time 1015   OT Time Calculation (min) 45 min   Activity Tolerance Patient tolerated treatment well      Past Medical History  Diagnosis Date  . Depression   . Anxiety     Past Surgical History  Procedure Laterality Date  . Cesarean section  08/29/1999 & 06/09/2004    x2    There were no vitals filed for this visit.      Subjective Assessment - 08/23/15 0936    Subjective  My trainer noticed less winging (re: scapula)    Pertinent History unknown etiology    Patient Stated Goals to improve ROM and strength Rt shoulder   Currently in Pain? No/denies                      OT Treatments/Exercises (OP) - 08/23/15 0001    Exercises   Exercises --  UBE x 5 min. Level 3   Neurological Re-education Exercises   Scapular Stabilization Bilateral;Supine;Prone;Seated;Standing;Quadraped   Other Information Reviewed all previously issued ex's from HEP - Pt did 5-10 reps. Pt also issued new exercise in supine for full shoulder flexion/ext bilaterally with 3-4 lb. weight while stabalizing scapula. (Alternative option given for airplane position prone as well for scapula retraction)                     OT Long Term Goals - 07/06/15 1357    OT LONG TERM GOAL #1   Title Pt independent with HEP for scapula stabalization and BUE's (due by 09/03/15)   Time 8   Period Weeks   Status New   OT LONG TERM GOAL #2   Title Pt to report increased ease getting pull over shirts on/off  overhead   Time 8   Period Weeks   Status New   OT LONG TERM GOAL #3   Title Pt to verbalize understanding of techniques to maintain shoulder integrity and positions to avoid    Time 8   Period Weeks   Status New               Plan - 08/23/15 1027    Clinical Impression Statement Pt reports increased ease with exercises but still has to focus to minimize scapula winging   Rehab Potential Good   Clinical Impairments Affecting Rehab Potential time since onset, ? etiology and presentation   OT Frequency 1x / week   OT Duration 8 weeks   OT Treatment/Interventions Self-care/ADL training;Therapeutic exercise;Patient/family education;Neuromuscular education;Manual Therapy;DME and/or AE instruction;Therapeutic activities;Electrical Stimulation;Moist Heat;Passive range of motion   Plan continue scapula stabalization, attempt closed chain reaching seated   Consulted and Agree with Plan of Care Patient      Patient will benefit from skilled therapeutic intervention in order to improve the following deficits and impairments:  Decreased endurance, Decreased activity tolerance, Decreased knowledge of precautions, Impaired UE functional use, Pain, Decreased strength  Visit Diagnosis: Muscle weakness (generalized)  Other symptoms and  signs involving the musculoskeletal system  Abnormal posture    Problem List Patient Active Problem List   Diagnosis Date Noted  . Brachial plexopathy 06/12/2015    Kelli Churn, OTR/L 08/23/2015, 10:29 AM  [facility] [address] [place], [place], [address] Phone: [phone]   Fax:  [phone]  Name: Sherri Navarro MRN: [id number] Date of Birth: Aug 28, 1970

## 2015-08-30 ENCOUNTER — Ambulatory Visit: Payer: BLUE CROSS/BLUE SHIELD | Admitting: Occupational Therapy

## 2015-08-30 ENCOUNTER — Ambulatory Visit: Payer: BLUE CROSS/BLUE SHIELD | Admitting: Licensed Clinical Social Worker

## 2015-09-06 ENCOUNTER — Ambulatory Visit: Payer: BLUE CROSS/BLUE SHIELD | Admitting: Occupational Therapy

## 2015-09-06 ENCOUNTER — Encounter: Payer: Self-pay | Admitting: Occupational Therapy

## 2015-09-06 DIAGNOSIS — M6281 Muscle weakness (generalized): Secondary | ICD-10-CM

## 2015-09-06 DIAGNOSIS — R293 Abnormal posture: Secondary | ICD-10-CM

## 2015-09-06 DIAGNOSIS — R29898 Other symptoms and signs involving the musculoskeletal system: Secondary | ICD-10-CM

## 2015-09-06 NOTE — Therapy (Signed)
[facility] [address] [place], [place], [address] Phone: [phone]   Fax:  [phone]  Occupational Therapy Treatment  Patient Details  Name: Sherri Navarro MRN: [id number] Date of Birth: 05/16/1970 Referring Provider: Dr. Naomie DeanAntonia Ahern  Encounter Date: 09/06/2015      OT End of Session - 09/06/15 1312    Visit Number 4   Number of Visits 8   Date for OT Re-Evaluation 09/24/15   Authorization Type [employer] (30 VISIT LIMIT)    OT Start Time 0930   OT Stop Time 1015   OT Time Calculation (min) 45 min   Activity Tolerance Patient tolerated treatment well      Past Medical History  Diagnosis Date  . Depression   . Anxiety     Past Surgical History  Procedure Laterality Date  . Cesarean section  08/29/1999 & 06/09/2004    x2    There were no vitals filed for this visit.      Subjective Assessment - 09/06/15 0934    Subjective  My trainer will stop me if I start winging. I'm still seeing the chiropractor   Pertinent History unknown etiology    Patient Stated Goals to improve ROM and strength Rt shoulder   Currently in Pain? No/denies                      OT Treatments/Exercises (OP) - 09/06/15 0001    Neurological Re-education Exercises   Scapular Stabilization Supine;Prone;Quadraped;Standing;Seated   Other Information Performed scapula stabalization ex's previously in supine, prone, quadraped. Additional ex's provided today in supine, prone on elbows, and seated (bridging off mat) - see pt instructions for details. Pt performed each x 10 reps                OT Education - 09/06/15 1013    Education provided Yes   Education Details Additional scapula stabilization HEP (prone, supine, and seated)   Person(s) Educated Patient   Methods Explanation;Demonstration;Handout   Comprehension Verbalized understanding;Returned demonstration             OT Long Term Goals -  07/06/15 1357    OT LONG TERM GOAL #1   Title Pt independent with HEP for scapula stabalization and BUE's (due by 09/03/15)   Time 8   Period Weeks   Status New   OT LONG TERM GOAL #2   Title Pt to report increased ease getting pull over shirts on/off overhead   Time 8   Period Weeks   Status New   OT LONG TERM GOAL #3   Title Pt to verbalize understanding of techniques to maintain shoulder integrity and positions to avoid    Time 8   Period Weeks   Status New               Plan - 09/06/15 1312    Rehab Potential Good   Clinical Impairments Affecting Rehab Potential time since onset, ? etiology and presentation   OT Frequency 1x / week   OT Duration 8 weeks   OT Treatment/Interventions Self-care/ADL training;Therapeutic exercise;Patient/family education;Neuromuscular education;Manual Therapy;DME and/or AE instruction;Therapeutic activities;Electrical Stimulation;Moist Heat;Passive range of motion   Plan review HEP issued today, AA/ROM closed chain as able   Consulted and Agree with Plan of Care Patient      Patient will benefit from skilled therapeutic intervention in order to improve the following deficits and impairments:  Decreased endurance, Decreased activity tolerance, Decreased knowledge of precautions,  Impaired UE functional use, Pain, Decreased strength  Visit Diagnosis: Muscle weakness (generalized)  Other symptoms and signs involving the musculoskeletal system  Abnormal posture    Problem List Patient Active Problem List   Diagnosis Date Noted  . Brachial plexopathy 06/12/2015    Kelli Churn, OTR/L 09/06/2015, 1:14 PM  [facility] [address] [place], [place], [address] Phone: [phone]   Fax:  [phone]  Name: Sherri Navarro MRN: [id number] Date of Birth: January 29, 1971

## 2015-09-06 NOTE — Patient Instructions (Signed)
1. On stomach on elbows: push through elbows to lift chest up and bring shoulder blades down, repeat 10 times. Do 3 sessions per day  2. All Fours Push-Up With Press-Up    Modified plank: On elbows with hands shoulder-width apart, bend elbows. Lift tummy up off mat on knees. Return to tummy on mat.   Repeat __10__ times. Do __3__ sessions per day.  3. On stomach on elbows: lift Lt arm up placing weight on Rt arm, repeat 10 times. Do 3 sessions per day  4. On back - hold 2 lb weight in Rt arm at 90 degrees shoulder flexion, keep elbow straight, reach up slightly towards ceiling, then bring back to starting position (shoulder blade goes out to side and back in) Repeat 10 times, 3 sessions per day  5. Sitting edge of bed, place arms slightly behind you, chest out, perform hip bridge with feet flat on floor. Repeat 10 times, 3 sessions per day

## 2015-09-13 ENCOUNTER — Ambulatory Visit: Payer: BLUE CROSS/BLUE SHIELD | Admitting: Occupational Therapy

## 2015-09-29 ENCOUNTER — Ambulatory Visit: Payer: BLUE CROSS/BLUE SHIELD | Attending: Neurology | Admitting: Occupational Therapy

## 2015-10-17 ENCOUNTER — Ambulatory Visit (INDEPENDENT_AMBULATORY_CARE_PROVIDER_SITE_OTHER): Payer: BLUE CROSS/BLUE SHIELD | Admitting: Licensed Clinical Social Worker

## 2015-10-17 DIAGNOSIS — F3341 Major depressive disorder, recurrent, in partial remission: Secondary | ICD-10-CM

## 2015-10-20 ENCOUNTER — Ambulatory Visit: Payer: BLUE CROSS/BLUE SHIELD | Attending: Neurology | Admitting: Occupational Therapy

## 2015-10-27 ENCOUNTER — Ambulatory Visit: Payer: BLUE CROSS/BLUE SHIELD | Admitting: Occupational Therapy

## 2015-11-08 ENCOUNTER — Ambulatory Visit (INDEPENDENT_AMBULATORY_CARE_PROVIDER_SITE_OTHER): Payer: BLUE CROSS/BLUE SHIELD | Admitting: Licensed Clinical Social Worker

## 2015-11-08 DIAGNOSIS — F3341 Major depressive disorder, recurrent, in partial remission: Secondary | ICD-10-CM

## 2015-11-15 ENCOUNTER — Encounter: Payer: Self-pay | Admitting: Occupational Therapy

## 2015-11-15 NOTE — Therapy (Signed)
[facility] [address] [place], [place], [address] Phone: [phone]   Fax:  [phone]  Patient Details  Name: Sherri Navarro MRN: [id number] Date of Birth: November 01, 1970 Referring Julissa Browning:  No ref. Maleigha Colvard found  Encounter Date: 11/15/2015  OCCUPATIONAL THERAPY DISCHARGE SUMMARY  Visits from Start of Care: 4  Current functional level related to goals / functional outcomes:     OT Long Term Goals - 07/06/15 1357      OT LONG TERM GOAL #1   Title Pt independent with HEP for scapula stabalization and BUE's (due by 09/03/15)   Time 8   Period Weeks   Status Met     OT LONG TERM GOAL #2   Title Pt to report increased ease getting pull over shirts on/off overhead   Time 8   Period Weeks   Status Unknown - pt did not return to assess     OT LONG TERM GOAL #3   Title Pt to verbalize understanding of techniques to maintain shoulder integrity and positions to avoid    Time 8   Period Weeks   Status Met    '   Remaining deficits: Scapula winging and decreased stabalization Decreased shoulder ROM/strength   Education / Equipment: HEP's for scapula strengthening/stabalization  Plan: Patient agrees to discharge.  Patient goals were partially met. Patient is being discharged due to not returning since the last visit.  (Pt did not return after 09/06/15 - multiple no shows)?????        Carey Bullocks, OTR/L  11/15/2015, 8:16 AM  [facility] [address] [place], [place], [address] Phone: [phone]   Fax:  [phone]

## 2016-06-12 DIAGNOSIS — H5213 Myopia, bilateral: Secondary | ICD-10-CM | POA: Diagnosis not present

## 2016-09-04 DIAGNOSIS — J3489 Other specified disorders of nose and nasal sinuses: Secondary | ICD-10-CM | POA: Diagnosis not present

## 2017-01-21 DIAGNOSIS — Z1231 Encounter for screening mammogram for malignant neoplasm of breast: Secondary | ICD-10-CM | POA: Diagnosis not present

## 2017-01-29 DIAGNOSIS — Z23 Encounter for immunization: Secondary | ICD-10-CM | POA: Diagnosis not present

## 2017-01-29 DIAGNOSIS — D649 Anemia, unspecified: Secondary | ICD-10-CM | POA: Diagnosis not present

## 2017-02-21 DIAGNOSIS — Z8379 Family history of other diseases of the digestive system: Secondary | ICD-10-CM | POA: Diagnosis not present

## 2017-02-21 DIAGNOSIS — Z8371 Family history of colonic polyps: Secondary | ICD-10-CM | POA: Diagnosis not present

## 2017-03-19 DIAGNOSIS — K635 Polyp of colon: Secondary | ICD-10-CM | POA: Diagnosis not present

## 2017-03-19 DIAGNOSIS — K621 Rectal polyp: Secondary | ICD-10-CM | POA: Diagnosis not present

## 2017-03-19 DIAGNOSIS — Z8371 Family history of colonic polyps: Secondary | ICD-10-CM | POA: Diagnosis not present

## 2017-04-30 DIAGNOSIS — H01119 Allergic dermatitis of unspecified eye, unspecified eyelid: Secondary | ICD-10-CM | POA: Diagnosis not present

## 2017-04-30 DIAGNOSIS — Z411 Encounter for cosmetic surgery: Secondary | ICD-10-CM | POA: Diagnosis not present

## 2017-08-05 DIAGNOSIS — S61412A Laceration without foreign body of left hand, initial encounter: Secondary | ICD-10-CM | POA: Diagnosis not present

## 2018-02-24 DIAGNOSIS — Z23 Encounter for immunization: Secondary | ICD-10-CM | POA: Diagnosis not present

## 2018-02-24 DIAGNOSIS — Z0000 Encounter for general adult medical examination without abnormal findings: Secondary | ICD-10-CM | POA: Diagnosis not present

## 2018-03-04 DIAGNOSIS — Z131 Encounter for screening for diabetes mellitus: Secondary | ICD-10-CM | POA: Diagnosis not present

## 2018-03-04 DIAGNOSIS — Z13220 Encounter for screening for lipoid disorders: Secondary | ICD-10-CM | POA: Diagnosis not present

## 2018-03-04 DIAGNOSIS — Z862 Personal history of diseases of the blood and blood-forming organs and certain disorders involving the immune mechanism: Secondary | ICD-10-CM | POA: Diagnosis not present

## 2018-03-20 DIAGNOSIS — Z1231 Encounter for screening mammogram for malignant neoplasm of breast: Secondary | ICD-10-CM | POA: Diagnosis not present

## 2018-03-20 DIAGNOSIS — Z124 Encounter for screening for malignant neoplasm of cervix: Secondary | ICD-10-CM | POA: Diagnosis not present

## 2018-03-20 DIAGNOSIS — Z01419 Encounter for gynecological examination (general) (routine) without abnormal findings: Secondary | ICD-10-CM | POA: Diagnosis not present

## 2018-05-13 DIAGNOSIS — Z79899 Other long term (current) drug therapy: Secondary | ICD-10-CM | POA: Diagnosis not present

## 2018-05-13 DIAGNOSIS — D649 Anemia, unspecified: Secondary | ICD-10-CM | POA: Diagnosis not present

## 2018-05-13 DIAGNOSIS — B351 Tinea unguium: Secondary | ICD-10-CM | POA: Diagnosis not present

## 2020-11-01 ENCOUNTER — Ambulatory Visit: Payer: 59 | Admitting: Neurology

## 2020-11-01 ENCOUNTER — Other Ambulatory Visit: Payer: Self-pay

## 2020-11-01 ENCOUNTER — Encounter: Payer: Self-pay | Admitting: Neurology

## 2020-11-01 VITALS — BP 123/82 | HR 61 | Ht 65.0 in | Wt 142.0 lb

## 2020-11-01 DIAGNOSIS — G540 Brachial plexus disorders: Secondary | ICD-10-CM

## 2020-11-01 DIAGNOSIS — G8929 Other chronic pain: Secondary | ICD-10-CM

## 2020-11-01 DIAGNOSIS — R292 Abnormal reflex: Secondary | ICD-10-CM

## 2020-11-01 DIAGNOSIS — R200 Anesthesia of skin: Secondary | ICD-10-CM

## 2020-11-01 DIAGNOSIS — G992 Myelopathy in diseases classified elsewhere: Secondary | ICD-10-CM

## 2020-11-01 DIAGNOSIS — M4802 Spinal stenosis, cervical region: Secondary | ICD-10-CM | POA: Diagnosis not present

## 2020-11-01 DIAGNOSIS — M542 Cervicalgia: Secondary | ICD-10-CM

## 2020-11-01 DIAGNOSIS — R29898 Other symptoms and signs involving the musculoskeletal system: Secondary | ICD-10-CM

## 2020-11-01 DIAGNOSIS — R202 Paresthesia of skin: Secondary | ICD-10-CM

## 2020-11-01 DIAGNOSIS — M958 Other specified acquired deformities of musculoskeletal system: Secondary | ICD-10-CM

## 2020-11-01 DIAGNOSIS — M67911 Unspecified disorder of synovium and tendon, right shoulder: Secondary | ICD-10-CM

## 2020-11-01 DIAGNOSIS — M7918 Myalgia, other site: Secondary | ICD-10-CM

## 2020-11-01 NOTE — Patient Instructions (Signed)
MRI cervical spine MRI chest to examine the brachial plexus EMG/NCS  Electromyoneurogram Electromyoneurogram is a test to check how well your muscles and nerves are working. This procedure includes the combined use of electromyogram (EMG) and nerve conduction study (NCS). EMG is used to look for muscular disorders. NCS, which is also called electroneurogram, measures how well your nerves arecontrolling your muscles. The procedures are usually done together to check if your muscles and nerves are healthy. If the results of the tests are abnormal, this may indicatedisease or injury, such as a neuromuscular disease or peripheral nerve damage. Tell a health care provider about: Any allergies you have. All medicines you are taking, including vitamins, herbs, eye drops, creams, and over-the-counter medicines. Any problems you or family members have had with anesthetic medicines. Any blood disorders you have. Any surgeries you have had. Any medical conditions you have. If you have a pacemaker. Whether you are pregnant or may be pregnant. What are the risks? Generally, this is a safe procedure. However, problems may occur, including: Infection where the electrodes were inserted. Bleeding. What happens before the procedure? Medicines Ask your health care provider about: Changing or stopping your regular medicines. This is especially important if you are taking diabetes medicines or blood thinners. Taking medicines such as aspirin and ibuprofen. These medicines can thin your blood. Do not take these medicines unless your health care provider tells you to take them. Taking over-the-counter medicines, vitamins, herbs, and supplements. General instructions Your health care provider may ask you to avoid: Beverages that have caffeine, such as coffee and tea. Any products that contain nicotine or tobacco. These products include cigarettes, e-cigarettes, and chewing tobacco. If you need help quitting,  ask your health care provider. Do not use lotions or creams on the same day that you will be having the procedure. What happens during the procedure? For EMG  Your health care provider will ask you to stay in a position so that he or she can access the muscle that will be studied. You may be standing, sitting, or lying down. You may be given a medicine that numbs the area (local anesthetic). A very thin needle that has an electrode will be inserted into your muscle. Another small electrode will be placed on your skin near the muscle. Your health care provider will ask you to continue to remain still. The electrodes will send a signal that tells about the electrical activity of your muscles. You may see this on a monitor or hear it in the room. After your muscles have been studied at rest, your health care provider will ask you to contract or flex your muscles. The electrodes will send a signal that tells about the electrical activity of your muscles. Your health care provider will remove the electrodes and the electrode needles when the procedure is finished. The procedure may vary among health care providers and hospitals. For NCS  An electrode that records your nerve activity (recording electrode) will be placed on your skin by the muscle that is being studied. An electrode that is used as a reference (reference electrode) will be placed near the recording electrode. A paste or gel will be applied to your skin between the recording electrode and the reference electrode. Your nerve will be stimulated with a mild shock. Your health care provider will measure how much time it takes for your muscle to react. Your health care provider will remove the electrodes and the gel when the procedure is finished. The procedure may  vary among health care providers and hospitals. What happens after the procedure? It is up to you to get the results of your procedure. Ask your health care provider, or the  department that is doing the procedure, when your results will be ready. Your health care provider may: Give you medicines for any pain. Monitor the insertion sites to make sure that bleeding stops. Summary Electromyoneurogram is a test to check how well your muscles and nerves are working. If the results of the tests are abnormal, this may indicate disease or injury. This is a safe procedure. However, problems may occur, such as bleeding and infection. Your health care provider will do two tests to complete this procedure. One checks your muscles (EMG) and another checks your nerves (NCS). It is up to you to get the results of your procedure. Ask your health care provider, or the department that is doing the procedure, when your results will be ready. This information is not intended to replace advice given to you by your health care provider. Make sure you discuss any questions you have with your healthcare provider. Document Revised: 12/17/2017 Document Reviewed: 11/29/2017 Elsevier Patient Education  2022 [employer].

## 2020-11-01 NOTE — Progress Notes (Addendum)
[facility]    Provider:  Dr Lucia Gaskins Requesting Provider: Jackquline Bosch MD Primary Care Provider:  Sigmund Hazel, MD  CC:  Evaluation for EMG/NCS procedure  HPI:  Sherri Navarro is a 50 y.o. female here as requested by Sigmund Hazel, MD for numbness and tingling/evaluation for emg/ncs. I reviewed notes from [facility] orthopedic and sports medicine, patient was evaluated for bilateral shoulder pain and dysfunction, right greater than left, shoulders have been a problem for about 10 years, symptoms started when she felt the tightness around the right shoulder blade and right arm it progressed into some numbness and tingling going down the arm, the symptoms progressed to weakness with raising her right arm above shoulder height, she was evaluated by chiropractor in [facility], she was told she has scapular dyskinesia and was sent for a course of PT, did not improve, she underwent an EMG of the right upper extremity that was inconclusive, repeat EMG was recommended but she declined, for the last 10 years she has not been able to raise her arms above shoulder height, her left arm has also lost the ability to raise over shoulder height, chiropractic aggravated her arm pain, Medrol Dosepak helped, no inciting events or injury prior to start of symptoms 10 years ago.  Requesting EMG nerve conduction study on both arms  She  has been stable since I last saw her in 2017, she exercises a lot, back in April she started feeling a little more discomfort in her right shoulder, she went to the chiropractor and that did not help, she got a muscle relaxer, she has significantly decreased range of motion on the right and left. Worse on the right but also on the left. She cannot lift her deltoid more than shoulder height. She has pain and discomfort in her shoulders, she has neck pain on the right side. She hs numbness and tingling in the arm, right > left, started with numbness and  tingling in the arm. No family history of neuromuscular disease. She has neck pain. It started with numbness and then discomfort and pain in the shoulder. No other focal neurologic deficits, associated symptoms, inciting events or modifiable factors.  Reviewed notes, labs and imaging from outside physicians, which showed:  No electrophysiologic evidence for median or ulnar neuropathy, radiculopathy or acute/ongoing brachial plexitis of the right arm.  However limited comparison to muscles of the left upper extremity showed differences in patterns of recruitment. 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  Review of Systems: Patient complains of symptoms per HPI as well as the following symptoms arm weakness. Pertinent negatives and positives per HPI. All others negative.   Social History   Socioeconomic History   Marital status: Married    Spouse name: Loraine Leriche    Number of children: 2   Years of education: 16   Highest education level: Not on file  Occupational History   Occupation: [employer]  Tobacco Use   Smoking status: Some Days    Packs/day: 0.25    Types: Cigarettes   Smokeless tobacco: Never  Substance and Sexual Activity   Alcohol use: No    Alcohol/week: 0.0 standard drinks    Comment: rare   Drug use: No   Sexual activity: Not on file  Other Topics Concern   Not on file  Social History Narrative  Lives with husband and 2 children   Caffeine use: 2 cups coffee/day.   Social Determinants of Health   Financial Resource Strain: Not on file  Food Insecurity: Not on file  Transportation Needs: Not on file  Physical Activity: Not on file  Stress: Not on file  Social Connections: Not on file  Intimate Partner Violence: Not on file    Family History  Problem Relation Age of Onset   Hypertension Mother    Diabetes  Mother    Heart disease Father    Heart disease Paternal Grandfather    Other Father        back problems   Other Sister        back problems   Urolithiasis Mother     Past Medical History:  Diagnosis Date   Anxiety    Depression     Patient Active Problem List   Diagnosis Date Noted   Brachial plexopathy 06/12/2015    Past Surgical History:  Procedure Laterality Date   CESAREAN SECTION  08/29/1999 & 06/09/2004   x2    Current Outpatient Medications  Medication Sig Dispense Refill   citalopram (CELEXA) 20 MG tablet Take 20 mg by mouth daily.  3   oxyCODONE-acetaminophen (PERCOCET) 10-325 MG tablet Take 1 hour prior to procedure. May repeat every 4 hours if needed. 10 tablet 0   No current facility-administered medications for this visit.    Allergies as of 11/01/2020 - Review Complete 11/01/2020  Allergen Reaction Noted   Aspirin  06/07/2015    Vitals: BP 123/82   Pulse 61   Ht 5\' 5"  (1.651 m)   Wt 142 lb (64.4 kg)   BMI 23.63 kg/m  Last Weight:  Wt Readings from Last 1 Encounters:  11/01/20 142 lb (64.4 kg)   Last Height:   Ht Readings from Last 1 Encounters:  11/01/20 5\' 5"  (1.651 m)     Physical exam: Exam: Gen: NAD, conversant, well nourised, well groomed                     CV: RRR, no MRG. No Carotid Bruits. No peripheral edema, warm, nontender Eyes: Conjunctivae clear without exudates or hemorrhage  Neuro: Detailed Neurologic Exam  Speech:    Speech is normal; fluent and spontaneous with normal comprehension.  Cognition:    The patient is oriented to person, place, and time;     recent and remote memory intact;     language fluent;     normal attention, concentration,     fund of knowledge Cranial Nerves:    The pupils are equal, round, and reactive to light. The fundi are normal and spontaneous venous pulsations are present. Visual fields are full to finger confrontation. Extraocular movements are intact. Trigeminal sensation is intact  and the muscles of mastication are normal. The face is symmetric. The palate elevates in the midline. Hearing intact. Voice is normal. Shoulder shrug is normal. The tongue has normal motion without fasciculations.   Coordination:    Normal finger to nose   Gait:    normal.   Motor Observation:    Winged scapula Tone:    Normal muscle tone.    Posture:    Posture is normal. normal erect    Strength:Left deltoid 4+/5, right delt 4/5, right external rotation mild weakness. Winging of scapula bilaterally.      Otherwise Strength is V/V in the upper and lower limbs.      Sensation: intact to LT  Reflex Exam:  DTR's:    Deep tendon reflexes in the upper and lower extremities are brisk  bilaterally.  Right triceps brisker than the left.  Toes:    The toes are downgoing bilaterally.   Clonus:    Clonus is absent.    Assessment/Plan: This is a 50 year old patient who I performed an EMG nerve conduction study in 2017.  She has an interesting presentation of chronic right greater than left arm weakness, ongoing for 10 years, she has been under the care of physician, she is seeing orthopedics multiple times, she feels that she might be worsening slightly, right arm symptoms have progressed into some numbness and tingling down and down the right arm, she cannot AB duct her right arm above 90 degrees/shoulder height, she has scapular winging differential is wide including cervical stenosis with myelopathy and radiculopathy, right brachial plexus injury which was suggested on EMG nerve conduction study in 2017, she likely needs an MRI of the cervical spine as well as an MRI of the chest to examine brachial plexus.  I also wonder if this could be related to any rotator cuff issues.  At this time I will order an EMG nerve conduction study, we will give her Percocet and use some numbing agents (last time she could not tolerate enough to make a definitive diagnosis), I think she also needs an MRI of  the cervical spine and MRI of the chest examining the brachial plexuses.   MRI cervical spine with c4 nerve root impingement but cannot fully explain winged scapula need MRI rotator cuff, EMG showed abnormal rhomboid MRI chest to examine brachial plexus normal Emg/ncs: could not tolerate last time, will try percocet and then we will use some topical spray. Need brachial plexus protocol bilaterally upper extremities.     Orders Placed This Encounter  Procedures   MR CERVICAL SPINE WO CONTRAST   MR CHEST W CONTRAST   Basic Metabolic Panel   NCV with EMG(electromyography)   Meds ordered this encounter  Medications   oxyCODONE-acetaminophen (PERCOCET) 10-325 MG tablet    Sig: Take 1 hour prior to procedure. May repeat every 4 hours if needed.    Dispense:  10 tablet    Refill:  0     Cc: Sigmund Hazel, MD,  Jackquline Bosch MD  Naomie Dean, MD  [facility] [address] [place], [place] [address]  Phone [phone] Fax [phone]

## 2020-11-02 LAB — BASIC METABOLIC PANEL
BUN/Creatinine Ratio: 25 — ABNORMAL HIGH (ref 9–23)
BUN: 14 mg/dL (ref 6–24)
CO2: 25 mmol/L (ref 20–29)
Calcium: 9.1 mg/dL (ref 8.7–10.2)
Chloride: 99 mmol/L (ref 96–106)
Creatinine, Ser: 0.56 mg/dL — ABNORMAL LOW (ref 0.57–1.00)
Glucose: 85 mg/dL (ref 65–99)
Potassium: 4.7 mmol/L (ref 3.5–5.2)
Sodium: 138 mmol/L (ref 134–144)
eGFR: 110 mL/min/{1.73_m2} (ref >59)

## 2020-11-06 ENCOUNTER — Encounter: Payer: Self-pay | Admitting: Neurology

## 2020-11-06 MED ORDER — OXYCODONE-ACETAMINOPHEN 10-325 MG PO TABS: ORAL_TABLET | ORAL | 0 refills | Status: DC

## 2020-11-07 ENCOUNTER — Telehealth: Payer: Self-pay | Admitting: Neurology

## 2020-11-07 NOTE — Telephone Encounter (Signed)
This is FYI, pt is asking that it be noted that Dr Lucia Gaskins informed her that because her NCS/EMG may take extra time if an opening at the end of the day becomes available please schedule it for pt.

## 2020-11-08 NOTE — Telephone Encounter (Signed)
I rescheduled to 8/22 slots ok per Dr. Lucia Gaskins.

## 2020-11-17 ENCOUNTER — Other Ambulatory Visit: Payer: Self-pay

## 2020-11-17 ENCOUNTER — Ambulatory Visit
Admission: RE | Admit: 2020-11-17 | Discharge: 2020-11-17 | Disposition: A | Discharge status: Home / Self Care | Payer: 59 | Source: Ambulatory Visit | Attending: Neurology | Admitting: Neurology

## 2020-11-17 DIAGNOSIS — G540 Brachial plexus disorders: Secondary | ICD-10-CM

## 2020-11-17 DIAGNOSIS — R29898 Other symptoms and signs involving the musculoskeletal system: Secondary | ICD-10-CM | POA: Diagnosis not present

## 2020-11-17 DIAGNOSIS — M542 Cervicalgia: Secondary | ICD-10-CM | POA: Diagnosis not present

## 2020-11-17 DIAGNOSIS — R202 Paresthesia of skin: Secondary | ICD-10-CM

## 2020-11-17 DIAGNOSIS — R292 Abnormal reflex: Secondary | ICD-10-CM

## 2020-11-17 DIAGNOSIS — M958 Other specified acquired deformities of musculoskeletal system: Secondary | ICD-10-CM

## 2020-11-17 DIAGNOSIS — R200 Anesthesia of skin: Secondary | ICD-10-CM | POA: Diagnosis not present

## 2020-11-17 DIAGNOSIS — G992 Myelopathy in diseases classified elsewhere: Secondary | ICD-10-CM

## 2020-11-17 DIAGNOSIS — G8929 Other chronic pain: Secondary | ICD-10-CM

## 2020-11-17 MED ORDER — GADOBENATE DIMEGLUMINE 529 MG/ML IV SOLN
13.0000 mL | Freq: Once | INTRAVENOUS | Status: AC | PRN
  Administered 2020-11-17: 13 mL via INTRAVENOUS

## 2020-11-21 ENCOUNTER — Telehealth: Payer: Self-pay | Admitting: Neurology

## 2020-11-21 NOTE — Telephone Encounter (Signed)
Pt called not understanding the results of the MRI. Pt is requesting a call back.

## 2020-11-21 NOTE — Telephone Encounter (Signed)
Spoke with [facility] radiology.  She will have a radiologist read the MRI chest ASAP.  I was told the tech likely put this in as a test that we will read so that is why they had not read it yet and did not know to read it.   Spoke with the patient and let her know that MRI chest results are pending and then will be read by [facility] radiology.  We discussed the results of the MRI C-spine the patient is amenable to proceeding with EMG and referral to Dr. Venetia Maxon.  Her questions were answered and she verbalized appreciation for the call.  She will await a call from Dr. Fredrich Birks office to schedule appointment.

## 2020-11-21 NOTE — Telephone Encounter (Signed)
Will document call in other pre-existing phone note.

## 2020-11-21 NOTE — Telephone Encounter (Signed)
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 This may be the cause of her weakness. I still think we need the emg/ncs and the results from the MRI chest but she also needs to see Dr. Venetia Maxon at [facility]. Also someone needs to call [facility] and get the MRI of the chest read, [facility] does not read those and it has been left unread for 4 days now if you can make that happen.  Satira Sark, this keeps happening. Is there any way we can discuss with [facility] that we do not read MRIs of the chest(looking at the brachial plexus)? Or maybe when we order the image we just need to state in the comments that [facility] imaging needs to read this particular MRI?  Thanks, dr Lucia Gaskins

## 2020-11-22 ENCOUNTER — Other Ambulatory Visit: Payer: Self-pay | Admitting: Neurology

## 2020-11-22 ENCOUNTER — Ambulatory Visit: Payer: BLUE CROSS/BLUE SHIELD | Admitting: Neurology

## 2020-11-22 DIAGNOSIS — M4802 Spinal stenosis, cervical region: Secondary | ICD-10-CM

## 2020-11-22 DIAGNOSIS — M5412 Radiculopathy, cervical region: Secondary | ICD-10-CM

## 2020-11-23 NOTE — Telephone Encounter (Signed)
Referral sent to [facility] for Dr. Stern. Phone: [phone]. 

## 2020-12-01 ENCOUNTER — Encounter: Payer: 59 | Admitting: Neurology

## 2020-12-05 ENCOUNTER — Encounter: Payer: 59 | Admitting: Neurology

## 2020-12-05 ENCOUNTER — Telehealth: Payer: Self-pay | Admitting: Neurology

## 2020-12-05 NOTE — Telephone Encounter (Signed)
Great - thanks

## 2020-12-05 NOTE — Telephone Encounter (Signed)
Pt called to r/s her NCS/EMG, when offered the 1st avail date(which was in Oct) she said this would cause her to not be able to have results to give to Dr Venetia Maxon on her appointment with him on 09-14.  Please call pt to discuss options.

## 2020-12-15 ENCOUNTER — Ambulatory Visit: Payer: 59 | Admitting: Neurology

## 2020-12-15 ENCOUNTER — Ambulatory Visit (INDEPENDENT_AMBULATORY_CARE_PROVIDER_SITE_OTHER): Payer: 59 | Admitting: Neurology

## 2020-12-15 DIAGNOSIS — R202 Paresthesia of skin: Secondary | ICD-10-CM | POA: Diagnosis not present

## 2020-12-15 DIAGNOSIS — R200 Anesthesia of skin: Secondary | ICD-10-CM | POA: Diagnosis not present

## 2020-12-15 DIAGNOSIS — R29898 Other symptoms and signs involving the musculoskeletal system: Secondary | ICD-10-CM

## 2020-12-15 DIAGNOSIS — Z0289 Encounter for other administrative examinations: Secondary | ICD-10-CM

## 2020-12-19 NOTE — Progress Notes (Addendum)
Full Name: Sherri Navarro Gender: Female MRN #: [id number] Date of Birth: September 26, 1970    Visit Date: 12/15/2020 10:09 Age: 50 Years Examining Physician: Naomie Dean, MD  Requesting Provider: Jackquline Bosch MD Primary Care Provider:  Sigmund Hazel, MD  History: Bilateral shoulder and neck pain, numbness and tingling, right > left for 10 years. Exam shows deltoid weakness, right external rotation weakness and winging of the scapula bilaterally(right > left).She cannot raise her arm above shoulder level. 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 MRI cervical spine significant for potential of c4 > c6 > c5 nerve root compression. MRI of the shoulder had multiple significant findings of supraspinatus,subscapularis tendinosis, Advanced atrophy and fatty infiltration of the teres minor muscle and teres major muscle suggesting chronic denervation. Also Intramuscular edema within the anterior deltoid muscle related to muscle strain versus acute denervation changes.   Summary:All nerves (as indicated in the following tables) were within normal limits.  The right anterior deltoid showed increased spontaneous activity and diminished motor unit recruitment.  The right teres major and teres minor showed diminished motor unit recruitment.  All remaining muscles (as indicated in the following tables) were within normal limits.      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 No emg findings to suggest cervical radiculopathy as a cause.   Naomie Dean M.D.  [facility] [address], [address] [place], [place] [address] Tel: [phone] Fax: [phone]  Verbal informed  consent was obtained from the patient, patient was informed of potential risk of procedure, including bruising, bleeding, hematoma formation, infection, muscle weakness, muscle pain, numbness, among others.        MNC    Nerve / Sites Muscle Latency Ref. Amplitude Ref. Rel Amp Segments Distance Velocity Ref. Area    ms ms mV mV %  cm m/s m/s mVms  R Median - APB     Wrist APB 3.3 ?4.4 8.0 ?4.0 100 Wrist - APB 7   34.6     Upper arm APB 7.3  7.9  98.4 Upper arm - Wrist 21 53 ?49 34.3  R Ulnar - ADM     Wrist ADM 2.7 ?3.3 11.0 ?6.0 100 Wrist - ADM 7   32.6     B.Elbow ADM 6.1  11.5  105 B.Elbow - Wrist 18 53 ?49 30.5     A.Elbow ADM 8.0  11.0  95.5 A.Elbow - B.Elbow 10 53 ?49 29.8         SNC    Nerve / Sites Rec. Site Peak Lat Ref.  Amp Ref. Segments Distance Peak Diff Ref.    ms ms V V  cm ms ms  R Radial - Anatomical snuff box (Forearm)     Forearm Wrist 2.4 ?2.9 36 ?15 Forearm - Wrist 10    R Median, Ulnar - Transcarpal comparison     Median Palm Wrist 1.9 ?2.2 89 ?35 Median Palm - Wrist 8       Ulnar Palm Wrist 1.9 ?2.2 16 ?12 Ulnar Palm - Wrist 8          Median Palm - Ulnar Palm  -0.0 ?0.4  R Median - Orthodromic (Dig II, Mid palm)  Dig II Wrist 3.1 ?3.4 22 ?10 Dig II - Wrist 13    R Ulnar - Orthodromic, (Dig V, Mid palm)     Dig V Wrist 2.7 ?3.1 5 ?5 Dig V - Wrist 11    R Lateral antebrachial cutaneous - Forearm (Elbow)     Elbow Forearm 2.4 ?3.0 16 ?10 Elbow - Forearm 12    R Medial antebrachial cutaneous - Forearm (Elbow)     Elbow Forearm 1.6 ?3.2 16 ?5 Elbow - Forearm 12                   F  Wave    Nerve F Lat Ref.   ms ms  R Ulnar - ADM 30.5 ?32.0  R Median - APB 28.2 ?31.0         EMG Summary Table    Spontaneous MUAP Recruitment  Muscle IA Fib PSW Fasc Other Amp Dur. Poly Pattern  R. Pronator teres Normal None None None _______ Normal Normal Normal Normal  R. Extensor indicis proprius Normal None None None _______ Normal Normal Normal Normal  R.  Extensor digitorum communis Normal None None None _______ Normal Normal Normal Normal  R. Flexor digitorum profundus (Ulnar) Normal None None None _______ Normal Normal Normal Normal  R. First dorsal interosseous Normal None None None _______ Normal Normal Normal Normal  R. Triceps brachii Normal None None None _______ Normal Normal Normal Normal  R. anterior Deltoid Normal None +2 None _______ Normal Normal Normal reduced  R. Brachioradialis Normal None None None _______ Normal Normal Normal Normal  R. Biceps brachii Normal None None None _______ Normal Normal Normal Normal  R. Supraspinatus Normal None None None _______ Normal Normal Normal Normal  R. Infraspinatus Normal None None None _______ Normal Normal Normal Normal  R. Abductor pollicis brevis Normal None None None _______ Normal Normal Normal Normal  R. Flexor pollicis longus Normal None None None _______ Normal Normal Normal Normal  R. Extensor carpi radialis brevis Normal None None None _______ Normal Normal Normal Normal  Serratus Anterior Normal None None None  Normal Normal Normal Normal  R. And L. Cervical paraspinals (low) Normal None None None  Normal Normal Normal Normal  L. Supraspinatus Normal None None None _______ Normal Normal Normal Normal  L. Infraspinatus Normal None None None _______ Normal Normal Normal Normal  R. Teres Major Normal None None None  Normal Normal Normal Reduced  R. Teres Minor Normal None None None  Normal Normal Normal Reduced  R Trapezius Normal None None None  Normal Normal Normal Normal  Right Rhomboid Normal None None None  Normal Normal Normal Normal     

## 2020-12-19 NOTE — Progress Notes (Signed)
See procedure note.

## 2020-12-20 ENCOUNTER — Other Ambulatory Visit: Payer: Self-pay | Admitting: Neurology

## 2020-12-20 ENCOUNTER — Telehealth: Payer: Self-pay | Admitting: Neurology

## 2020-12-20 NOTE — Addendum Note (Signed)
Addended by: Naomie Dean B on: 12/20/2020 04:06 PM   Modules accepted: Orders

## 2020-12-20 NOTE — Telephone Encounter (Signed)
Patient called and left a voicemail on my phone stating that Dr. Lucia Gaskins is ordering her a stat MRI Shoulder to have before her appointment with the [facility]... I don't see an order put in.

## 2020-12-21 ENCOUNTER — Other Ambulatory Visit: Payer: Self-pay | Admitting: Neurology

## 2020-12-21 NOTE — Telephone Encounter (Signed)
Noted, [employer] auth: [id number] (exp. 12/21/20 to 01/19/21)  Sent message to Rinaldo Cloud at [facility] to reach out to the patient as soon as possible to schedule.

## 2020-12-21 NOTE — Addendum Note (Signed)
Addended by: Naomie Dean B on: 12/21/2020 12:41 PM   Modules accepted: Orders

## 2020-12-22 ENCOUNTER — Other Ambulatory Visit: Payer: Self-pay

## 2020-12-22 ENCOUNTER — Ambulatory Visit
Admission: RE | Admit: 2020-12-22 | Discharge: 2020-12-22 | Disposition: A | Discharge status: Home / Self Care | Payer: 59 | Source: Ambulatory Visit | Attending: Neurology | Admitting: Neurology

## 2020-12-22 DIAGNOSIS — M67911 Unspecified disorder of synovium and tendon, right shoulder: Secondary | ICD-10-CM

## 2020-12-22 DIAGNOSIS — R29898 Other symptoms and signs involving the musculoskeletal system: Secondary | ICD-10-CM

## 2020-12-22 DIAGNOSIS — M7918 Myalgia, other site: Secondary | ICD-10-CM

## 2020-12-22 DIAGNOSIS — M958 Other specified acquired deformities of musculoskeletal system: Secondary | ICD-10-CM

## 2020-12-22 NOTE — Telephone Encounter (Signed)
Noted, patient is scheduled today at [facility] .

## 2020-12-28 ENCOUNTER — Other Ambulatory Visit: Payer: Self-pay | Admitting: Neurology

## 2020-12-28 DIAGNOSIS — G7100 Muscular dystrophy, unspecified: Secondary | ICD-10-CM

## 2020-12-28 DIAGNOSIS — M958 Other specified acquired deformities of musculoskeletal system: Secondary | ICD-10-CM

## 2020-12-29 ENCOUNTER — Telehealth: Payer: Self-pay | Admitting: Neurology

## 2020-12-29 NOTE — Telephone Encounter (Signed)
Pt called, would like call from the nurse to schedule a Nerve Conduction for next week.

## 2021-01-02 ENCOUNTER — Telehealth: Payer: Self-pay | Admitting: Neurology

## 2021-01-02 NOTE — Telephone Encounter (Signed)
I LVM-If this patient calls back she can speak with either Bethany or myself. She's schedule for Thursday and Dr. Lucia Gaskins wanted to see if she can come in on Wednesday instead when Joni Reining is here just in case. Just need to schedule EMG, not NCS. Wednesday at 130 for some extra emg testing? Block 1:30-2:30 for her, she can get here just a few minutes prior to 130 (doesn't have to have a NCS or come early)

## 2021-01-02 NOTE — Telephone Encounter (Signed)
She called back and will be here for the EMG on Wednesday at 1:30pm

## 2021-01-02 NOTE — Telephone Encounter (Signed)
Tori/Jillian: Would one of you be able to call my 2pm video appointment on Wednesday and reschedule to an NP? She has not complied with my recommendations so I suspect she would be a no-show anyway but please call and reshedule her to an NP video call 15 or 30 minutes. Then would you please call Alee Gressman and see if she can be here Wednesday at 130 for some extra emg testing? Block 1:30-2:30 for her, she can get here just a few minutes prior to 130 (doesn't have to have a NCS or come early) thank you!

## 2021-01-04 ENCOUNTER — Ambulatory Visit (INDEPENDENT_AMBULATORY_CARE_PROVIDER_SITE_OTHER): Payer: Self-pay | Admitting: Neurology

## 2021-01-04 DIAGNOSIS — Z0289 Encounter for other administrative examinations: Secondary | ICD-10-CM

## 2021-01-04 DIAGNOSIS — R29898 Other symptoms and signs involving the musculoskeletal system: Secondary | ICD-10-CM

## 2021-01-05 ENCOUNTER — Encounter: Payer: 59 | Admitting: Neurology

## 2021-01-05 NOTE — Procedures (Signed)
Full Name: Sherri Navarro Gender: Female MRN #: [id number] Date of Birth: September 26, 1970    Visit Date: 12/15/2020 10:09 Age: 50 Years Examining Physician: Naomie Dean, MD  Requesting Provider: Jackquline Bosch MD Primary Care Provider:  Sigmund Hazel, MD  History: Bilateral shoulder and neck pain, numbness and tingling, right > left for 10 years. Exam shows deltoid weakness, right external rotation weakness and winging of the scapula bilaterally(right > left).She cannot raise her arm above shoulder level. 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 MRI cervical spine significant for potential of c4 > c6 > c5 nerve root compression. MRI of the shoulder had multiple significant findings of supraspinatus,subscapularis tendinosis, Advanced atrophy and fatty infiltration of the teres minor muscle and teres major muscle suggesting chronic denervation. Also Intramuscular edema within the anterior deltoid muscle related to muscle strain versus acute denervation changes.   Summary:All nerves (as indicated in the following tables) were within normal limits.  The right anterior deltoid showed increased spontaneous activity and diminished motor unit recruitment.  The right teres major and teres minor showed diminished motor unit recruitment.  All remaining muscles (as indicated in the following tables) were within normal limits.      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 No emg findings to suggest cervical radiculopathy as a cause.   Naomie Dean M.D.  [facility] [address], [address] [place], [place] [address] Tel: [phone] Fax: [phone]  Verbal informed  consent was obtained from the patient, patient was informed of potential risk of procedure, including bruising, bleeding, hematoma formation, infection, muscle weakness, muscle pain, numbness, among others.        MNC    Nerve / Sites Muscle Latency Ref. Amplitude Ref. Rel Amp Segments Distance Velocity Ref. Area    ms ms mV mV %  cm m/s m/s mVms  R Median - APB     Wrist APB 3.3 ?4.4 8.0 ?4.0 100 Wrist - APB 7   34.6     Upper arm APB 7.3  7.9  98.4 Upper arm - Wrist 21 53 ?49 34.3  R Ulnar - ADM     Wrist ADM 2.7 ?3.3 11.0 ?6.0 100 Wrist - ADM 7   32.6     B.Elbow ADM 6.1  11.5  105 B.Elbow - Wrist 18 53 ?49 30.5     A.Elbow ADM 8.0  11.0  95.5 A.Elbow - B.Elbow 10 53 ?49 29.8         SNC    Nerve / Sites Rec. Site Peak Lat Ref.  Amp Ref. Segments Distance Peak Diff Ref.    ms ms V V  cm ms ms  R Radial - Anatomical snuff box (Forearm)     Forearm Wrist 2.4 ?2.9 36 ?15 Forearm - Wrist 10    R Median, Ulnar - Transcarpal comparison     Median Palm Wrist 1.9 ?2.2 89 ?35 Median Palm - Wrist 8       Ulnar Palm Wrist 1.9 ?2.2 16 ?12 Ulnar Palm - Wrist 8          Median Palm - Ulnar Palm  -0.0 ?0.4  R Median - Orthodromic (Dig II, Mid palm)  Dig II Wrist 3.1 ?3.4 22 ?10 Dig II - Wrist 13    R Ulnar - Orthodromic, (Dig V, Mid palm)     Dig V Wrist 2.7 ?3.1 5 ?5 Dig V - Wrist 11    R Lateral antebrachial cutaneous - Forearm (Elbow)     Elbow Forearm 2.4 ?3.0 16 ?10 Elbow - Forearm 12    R Medial antebrachial cutaneous - Forearm (Elbow)     Elbow Forearm 1.6 ?3.2 16 ?5 Elbow - Forearm 12                   F  Wave    Nerve F Lat Ref.   ms ms  R Ulnar - ADM 30.5 ?32.0  R Median - APB 28.2 ?31.0         EMG Summary Table    Spontaneous MUAP Recruitment  Muscle IA Fib PSW Fasc Other Amp Dur. Poly Pattern  R. Pronator teres Normal None None None _______ Normal Normal Normal Normal  R. Extensor indicis proprius Normal None None None _______ Normal Normal Normal Normal  R.  Extensor digitorum communis Normal None None None _______ Normal Normal Normal Normal  R. Flexor digitorum profundus (Ulnar) Normal None None None _______ Normal Normal Normal Normal  R. First dorsal interosseous Normal None None None _______ Normal Normal Normal Normal  R. Triceps brachii Normal None None None _______ Normal Normal Normal Normal  R. anterior Deltoid Normal None +2 None _______ Normal Normal Normal reduced  R. Brachioradialis Normal None None None _______ Normal Normal Normal Normal  R. Biceps brachii Normal None None None _______ Normal Normal Normal Normal  R. Supraspinatus Normal None None None _______ Normal Normal Normal Normal  R. Infraspinatus Normal None None None _______ Normal Normal Normal Normal  R. Abductor pollicis brevis Normal None None None _______ Normal Normal Normal Normal  R. Flexor pollicis longus Normal None None None _______ Normal Normal Normal Normal  R. Extensor carpi radialis brevis Normal None None None _______ Normal Normal Normal Normal  Serratus Anterior Normal None None None  Normal Normal Normal Normal  R. And L. Cervical paraspinals (low) Normal None None None  Normal Normal Normal Normal  L. Supraspinatus Normal None None None _______ Normal Normal Normal Normal  L. Infraspinatus Normal None None None _______ Normal Normal Normal Normal  R. Teres Major Normal None None None  Normal Normal Normal Reduced  R. Teres Minor Normal None None None  Normal Normal Normal Reduced  R Trapezius Normal None None None  Normal Normal Normal Normal  Right Rhomboid Normal None None None  Normal Normal Normal Normal

## 2021-01-05 NOTE — Progress Notes (Signed)
Full Name: Sherri Navarro Gender: Female MRN #: [id number] Date of Birth: September 26, 1970    Visit Date: 12/15/2020 10:09 Age: 50 Years Examining Physician: Naomie Dean, MD  Requesting Provider: Jackquline Bosch MD Primary Care Provider:  Sigmund Hazel, MD  History: Bilateral shoulder and neck pain, numbness and tingling, right > left for 10 years. Exam shows deltoid weakness, right external rotation weakness and winging of the scapula bilaterally(right > left).She cannot raise her arm above shoulder level. 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 MRI cervical spine significant for potential of c4 > c6 > c5 nerve root compression. MRI of the shoulder had multiple significant findings of supraspinatus,subscapularis tendinosis, Advanced atrophy and fatty infiltration of the teres minor muscle and teres major muscle suggesting chronic denervation. Also Intramuscular edema within the anterior deltoid muscle related to muscle strain versus acute denervation changes.   Summary:All nerves (as indicated in the following tables) were within normal limits.  The right anterior deltoid showed increased spontaneous activity and diminished motor unit recruitment.  The right teres major and teres minor showed diminished motor unit recruitment.  All remaining muscles (as indicated in the following tables) were within normal limits.      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 No emg findings to suggest cervical radiculopathy as a cause.   Naomie Dean M.D.  [facility] [address], [address] [place], [place] [address] Tel: [phone] Fax: [phone]  Verbal informed  consent was obtained from the patient, patient was informed of potential risk of procedure, including bruising, bleeding, hematoma formation, infection, muscle weakness, muscle pain, numbness, among others.        MNC    Nerve / Sites Muscle Latency Ref. Amplitude Ref. Rel Amp Segments Distance Velocity Ref. Area    ms ms mV mV %  cm m/s m/s mVms  R Median - APB     Wrist APB 3.3 ?4.4 8.0 ?4.0 100 Wrist - APB 7   34.6     Upper arm APB 7.3  7.9  98.4 Upper arm - Wrist 21 53 ?49 34.3  R Ulnar - ADM     Wrist ADM 2.7 ?3.3 11.0 ?6.0 100 Wrist - ADM 7   32.6     B.Elbow ADM 6.1  11.5  105 B.Elbow - Wrist 18 53 ?49 30.5     A.Elbow ADM 8.0  11.0  95.5 A.Elbow - B.Elbow 10 53 ?49 29.8         SNC    Nerve / Sites Rec. Site Peak Lat Ref.  Amp Ref. Segments Distance Peak Diff Ref.    ms ms V V  cm ms ms  R Radial - Anatomical snuff box (Forearm)     Forearm Wrist 2.4 ?2.9 36 ?15 Forearm - Wrist 10    R Median, Ulnar - Transcarpal comparison     Median Palm Wrist 1.9 ?2.2 89 ?35 Median Palm - Wrist 8       Ulnar Palm Wrist 1.9 ?2.2 16 ?12 Ulnar Palm - Wrist 8          Median Palm - Ulnar Palm  -0.0 ?0.4  R Median - Orthodromic (Dig II, Mid palm)  Dig II Wrist 3.1 ?3.4 22 ?10 Dig II - Wrist 13    R Ulnar - Orthodromic, (Dig V, Mid palm)     Dig V Wrist 2.7 ?3.1 5 ?5 Dig V - Wrist 11    R Lateral antebrachial cutaneous - Forearm (Elbow)     Elbow Forearm 2.4 ?3.0 16 ?10 Elbow - Forearm 12    R Medial antebrachial cutaneous - Forearm (Elbow)     Elbow Forearm 1.6 ?3.2 16 ?5 Elbow - Forearm 12                   F  Wave    Nerve F Lat Ref.   ms ms  R Ulnar - ADM 30.5 ?32.0  R Median - APB 28.2 ?31.0         EMG Summary Table    Spontaneous MUAP Recruitment  Muscle IA Fib PSW Fasc Other Amp Dur. Poly Pattern  R. Pronator teres Normal None None None _______ Normal Normal Normal Normal  R. Extensor indicis proprius Normal None None None _______ Normal Normal Normal Normal  R.  Extensor digitorum communis Normal None None None _______ Normal Normal Normal Normal  R. Flexor digitorum profundus (Ulnar) Normal None None None _______ Normal Normal Normal Normal  R. First dorsal interosseous Normal None None None _______ Normal Normal Normal Normal  R. Triceps brachii Normal None None None _______ Normal Normal Normal Normal  R. anterior Deltoid Normal None +2 None _______ Normal Normal Normal reduced  R. Brachioradialis Normal None None None _______ Normal Normal Normal Normal  R. Biceps brachii Normal None None None _______ Normal Normal Normal Normal  R. Supraspinatus Normal None None None _______ Normal Normal Normal Normal  R. Infraspinatus Normal None None None _______ Normal Normal Normal Normal  R. Abductor pollicis brevis Normal None None None _______ Normal Normal Normal Normal  R. Flexor pollicis longus Normal None None None _______ Normal Normal Normal Normal  R. Extensor carpi radialis brevis Normal None None None _______ Normal Normal Normal Normal  Serratus Anterior Normal None None None  Normal Normal Normal Normal  R. And L. Cervical paraspinals (low) Normal None None None  Normal Normal Normal Normal  L. Supraspinatus Normal None None None _______ Normal Normal Normal Normal  L. Infraspinatus Normal None None None _______ Normal Normal Normal Normal  R. Teres Major Normal None None None  Normal Normal Normal Reduced  R. Teres Minor Normal None None None  Normal Normal Normal Reduced  R Trapezius Normal None None None  Normal Normal Normal Normal  Right Rhomboid Normal None None None  Normal Normal Normal Normal

## 2021-01-10 ENCOUNTER — Telehealth: Payer: Self-pay | Admitting: Neurology

## 2021-01-10 DIAGNOSIS — M958 Other specified acquired deformities of musculoskeletal system: Secondary | ICD-10-CM

## 2021-01-10 DIAGNOSIS — G7100 Muscular dystrophy, unspecified: Secondary | ICD-10-CM

## 2021-01-10 NOTE — Telephone Encounter (Signed)
Spoke with Brandy @ [facility]rology[facility]uromuscular clinic regarding patient's referral. They are unable to see patient because they don't accept her insurance [facility]). She states [facility] is [facility]'s network. Is there a specific practice/provider you would like for me to send referral?

## 2021-01-16 NOTE — Addendum Note (Signed)
Addended by: Bertram Savin on: 01/16/2021 01:33 PM   Modules accepted: Orders

## 2021-01-25 NOTE — Telephone Encounter (Signed)
Referral faxed to [facility]urom[facility] clinic. Phone: [phone].

## 2021-02-23 ENCOUNTER — Other Ambulatory Visit: Payer: Self-pay | Admitting: Neurology

## 2021-02-23 MED ORDER — MELOXICAM 15 MG PO TABS: 15.0000 mg | ORAL_TABLET | Freq: Every day | ORAL | 1 refills | Status: AC

## 2021-02-23 MED ORDER — OXYCODONE-ACETAMINOPHEN 10-325 MG PO TABS: 1.0000 | ORAL_TABLET | Freq: Four times a day (QID) | ORAL | 0 refills | Status: AC | PRN

## 2021-02-23 NOTE — Telephone Encounter (Signed)
Per [place] registry, last filled oxycodone-acet on 11/06/20. No other opiates filled. Lorazepam last filled 30 tabs in 05/2020.

## 2021-04-28 DIAGNOSIS — Z886 Allergy status to analgesic agent status: Secondary | ICD-10-CM | POA: Diagnosis not present

## 2021-04-28 DIAGNOSIS — M958 Other specified acquired deformities of musculoskeletal system: Secondary | ICD-10-CM | POA: Diagnosis not present

## 2021-04-28 DIAGNOSIS — R69 Illness, unspecified: Secondary | ICD-10-CM | POA: Diagnosis not present

## 2021-05-09 DIAGNOSIS — M958 Other specified acquired deformities of musculoskeletal system: Secondary | ICD-10-CM | POA: Diagnosis not present

## 2021-05-24 ENCOUNTER — Other Ambulatory Visit: Payer: Self-pay | Admitting: Neurology

## 2021-05-31 DIAGNOSIS — E785 Hyperlipidemia, unspecified: Secondary | ICD-10-CM | POA: Diagnosis not present

## 2021-05-31 DIAGNOSIS — Z79899 Other long term (current) drug therapy: Secondary | ICD-10-CM | POA: Diagnosis not present

## 2021-06-09 DIAGNOSIS — M958 Other specified acquired deformities of musculoskeletal system: Secondary | ICD-10-CM | POA: Diagnosis not present

## 2021-06-20 DIAGNOSIS — M958 Other specified acquired deformities of musculoskeletal system: Secondary | ICD-10-CM | POA: Diagnosis not present

## 2021-06-27 DIAGNOSIS — M958 Other specified acquired deformities of musculoskeletal system: Secondary | ICD-10-CM | POA: Diagnosis not present

## 2021-06-29 DIAGNOSIS — M958 Other specified acquired deformities of musculoskeletal system: Secondary | ICD-10-CM | POA: Diagnosis not present

## 2021-07-04 DIAGNOSIS — M958 Other specified acquired deformities of musculoskeletal system: Secondary | ICD-10-CM | POA: Diagnosis not present

## 2021-07-17 DIAGNOSIS — M958 Other specified acquired deformities of musculoskeletal system: Secondary | ICD-10-CM | POA: Diagnosis not present

## 2021-07-26 DIAGNOSIS — M958 Other specified acquired deformities of musculoskeletal system: Secondary | ICD-10-CM | POA: Diagnosis not present

## 2021-09-12 DIAGNOSIS — G540 Brachial plexus disorders: Secondary | ICD-10-CM | POA: Diagnosis not present

## 2021-09-12 DIAGNOSIS — M25511 Pain in right shoulder: Secondary | ICD-10-CM | POA: Diagnosis not present

## 2021-09-12 DIAGNOSIS — M25512 Pain in left shoulder: Secondary | ICD-10-CM | POA: Diagnosis not present

## 2021-10-18 ENCOUNTER — Encounter: Payer: Self-pay | Admitting: Neurology

## 2022-03-14 DIAGNOSIS — Z01419 Encounter for gynecological examination (general) (routine) without abnormal findings: Secondary | ICD-10-CM | POA: Diagnosis not present

## 2022-03-14 DIAGNOSIS — Z1231 Encounter for screening mammogram for malignant neoplasm of breast: Secondary | ICD-10-CM | POA: Diagnosis not present

## 2022-03-23 DIAGNOSIS — Z6826 Body mass index (BMI) 26.0-26.9, adult: Secondary | ICD-10-CM | POA: Diagnosis not present

## 2022-03-23 DIAGNOSIS — H5789 Other specified disorders of eye and adnexa: Secondary | ICD-10-CM | POA: Diagnosis not present

## 2022-05-11 DIAGNOSIS — K625 Hemorrhage of anus and rectum: Secondary | ICD-10-CM | POA: Diagnosis not present

## 2022-05-14 DIAGNOSIS — K625 Hemorrhage of anus and rectum: Secondary | ICD-10-CM | POA: Diagnosis not present

## 2022-06-06 DIAGNOSIS — Z0000 Encounter for general adult medical examination without abnormal findings: Secondary | ICD-10-CM | POA: Diagnosis not present

## 2022-06-06 DIAGNOSIS — N951 Menopausal and female climacteric states: Secondary | ICD-10-CM | POA: Diagnosis not present

## 2022-06-06 DIAGNOSIS — E782 Mixed hyperlipidemia: Secondary | ICD-10-CM | POA: Diagnosis not present

## 2022-06-12 DIAGNOSIS — Z83719 Family history of colon polyps, unspecified: Secondary | ICD-10-CM | POA: Diagnosis not present

## 2022-06-12 DIAGNOSIS — K648 Other hemorrhoids: Secondary | ICD-10-CM | POA: Diagnosis not present

## 2022-06-12 DIAGNOSIS — K625 Hemorrhage of anus and rectum: Secondary | ICD-10-CM | POA: Diagnosis not present

## 2022-07-10 ENCOUNTER — Other Ambulatory Visit (HOSPITAL_BASED_OUTPATIENT_CLINIC_OR_DEPARTMENT_OTHER): Payer: Self-pay | Admitting: Pain Medicine

## 2022-07-10 DIAGNOSIS — E782 Mixed hyperlipidemia: Secondary | ICD-10-CM | POA: Diagnosis not present

## 2022-07-10 DIAGNOSIS — Z6826 Body mass index (BMI) 26.0-26.9, adult: Secondary | ICD-10-CM | POA: Diagnosis not present

## 2022-07-10 DIAGNOSIS — I10 Essential (primary) hypertension: Secondary | ICD-10-CM | POA: Diagnosis not present

## 2022-08-07 ENCOUNTER — Ambulatory Visit (HOSPITAL_BASED_OUTPATIENT_CLINIC_OR_DEPARTMENT_OTHER)
Admission: RE | Admit: 2022-08-07 | Discharge: 2022-08-07 | Disposition: A | Discharge status: Home / Self Care | Payer: Self-pay | Source: Ambulatory Visit | Attending: Pain Medicine | Admitting: Pain Medicine

## 2022-08-07 ENCOUNTER — Encounter (HOSPITAL_BASED_OUTPATIENT_CLINIC_OR_DEPARTMENT_OTHER): Payer: Self-pay

## 2022-08-07 DIAGNOSIS — E782 Mixed hyperlipidemia: Secondary | ICD-10-CM | POA: Insufficient documentation

## 2022-09-12 DIAGNOSIS — F17200 Nicotine dependence, unspecified, uncomplicated: Secondary | ICD-10-CM | POA: Diagnosis not present

## 2022-09-12 DIAGNOSIS — Z6825 Body mass index (BMI) 25.0-25.9, adult: Secondary | ICD-10-CM | POA: Diagnosis not present

## 2022-09-12 DIAGNOSIS — E782 Mixed hyperlipidemia: Secondary | ICD-10-CM | POA: Diagnosis not present

## 2022-09-12 DIAGNOSIS — I10 Essential (primary) hypertension: Secondary | ICD-10-CM | POA: Diagnosis not present

## 2022-09-14 DIAGNOSIS — E782 Mixed hyperlipidemia: Secondary | ICD-10-CM | POA: Diagnosis not present

## 2023-01-02 IMAGING — MR MR SHOULDER*R* W/O CM
4 of 6 series · 17 of 40 positions shown · non-contrast
Comparison: Brachial plexus MRI 11/17/2020

CLINICAL DATA: Shoulder pain, rotator cuff disorder suspected, xray
done rotator cuff weakness/scapula winging

EXAM:
MRI OF THE RIGHT SHOULDER WITHOUT CONTRAST
TECHNIQUE: Multiplanar, multisequence MR imaging of the shoulder was performed.
No intravenous contrast was administered.

[Series 6: T2 fat-sat · axial · right · 3.0mm · 0.59mm/px · z∈[-58,+16]mm · 3 of 27 slices shown (1 of 2)]
[im 4/27]
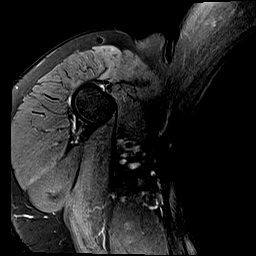
[im 15/27]
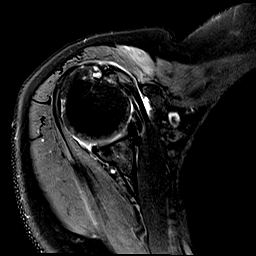
[im 23/27]
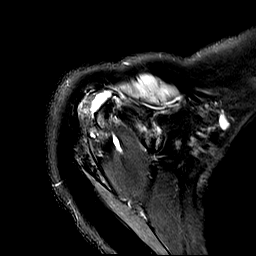

[Series 7: T2 fat-sat · sagittal · right · 4.0mm · 0.22mm/px · 3 of 21 slices shown (2 of 2)]
[im 5/21]
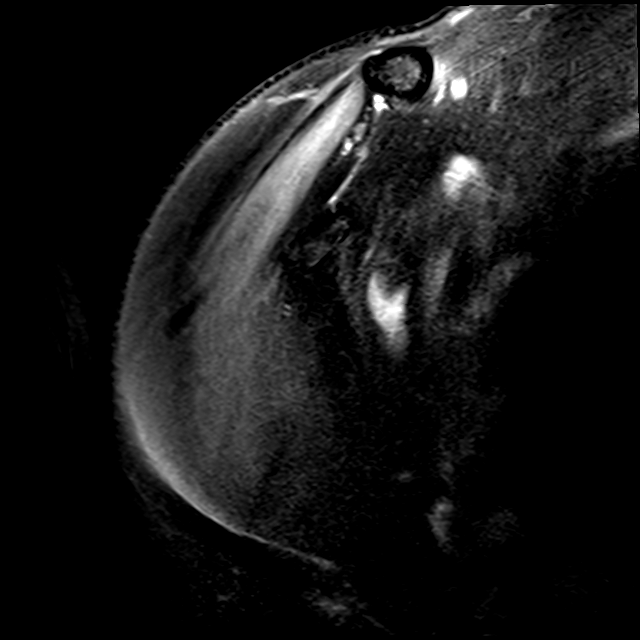
[im 13/21]
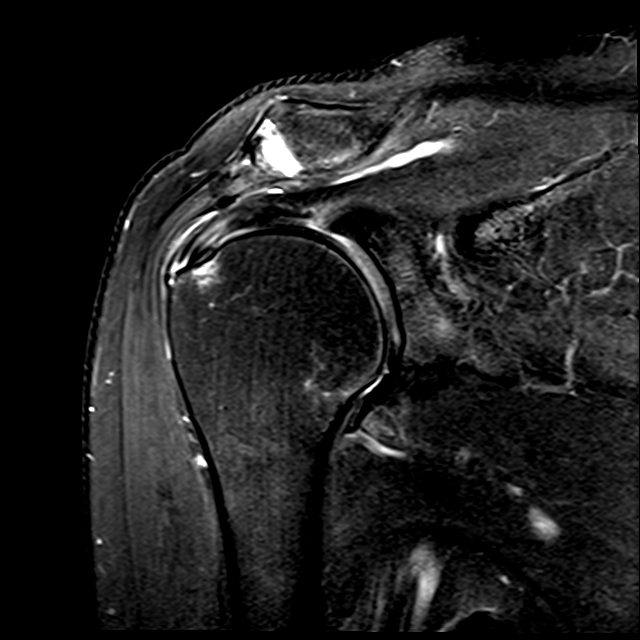
[im 21/21]
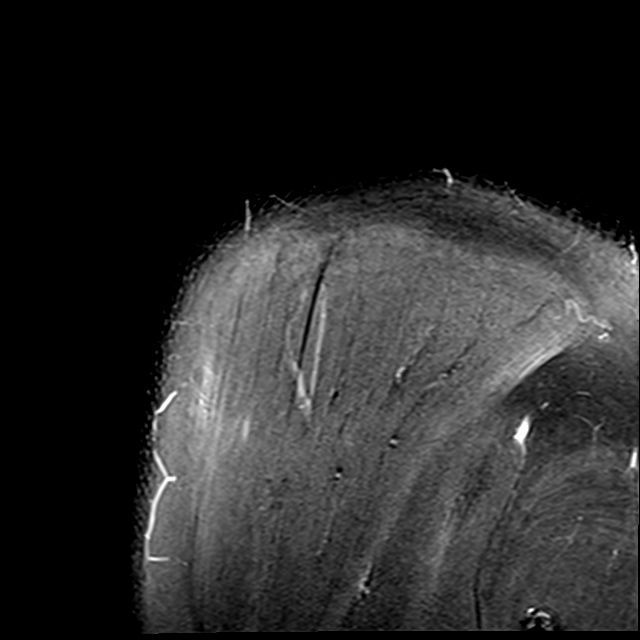

[Series 8: PD · sagittal · right · 4.0mm · 0.22mm/px · 6 of 21 slices shown (1 of 2)]
[im 1/21]
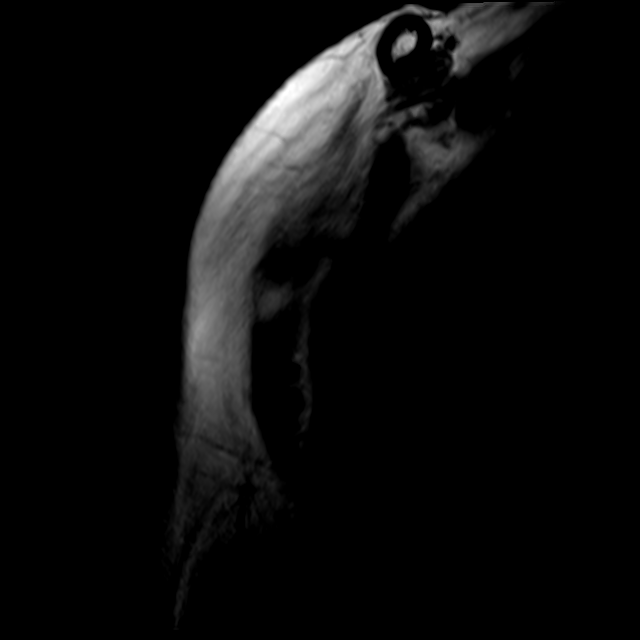
[im 5/21]
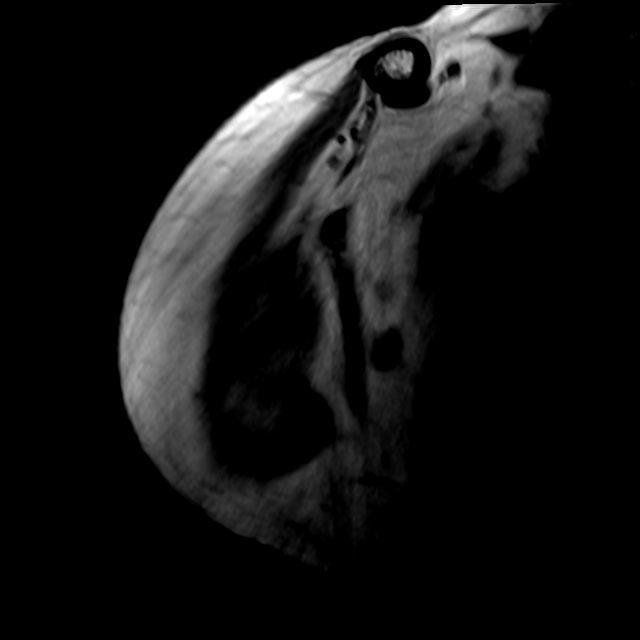
[im 9/21]
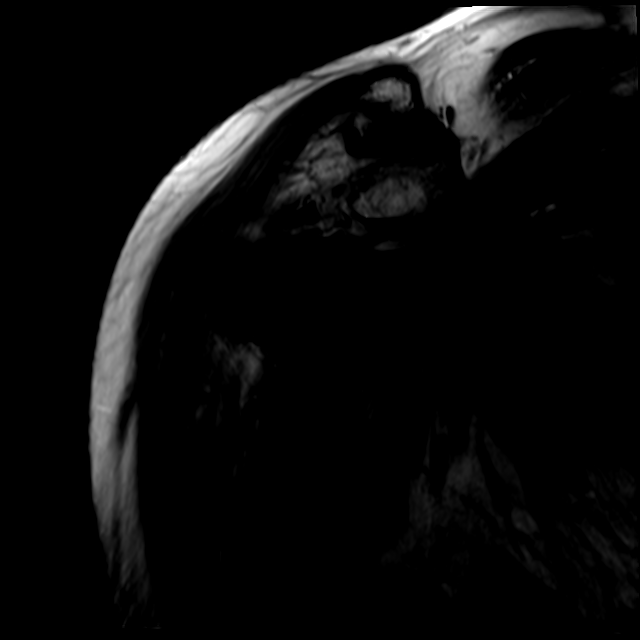
[im 13/21]
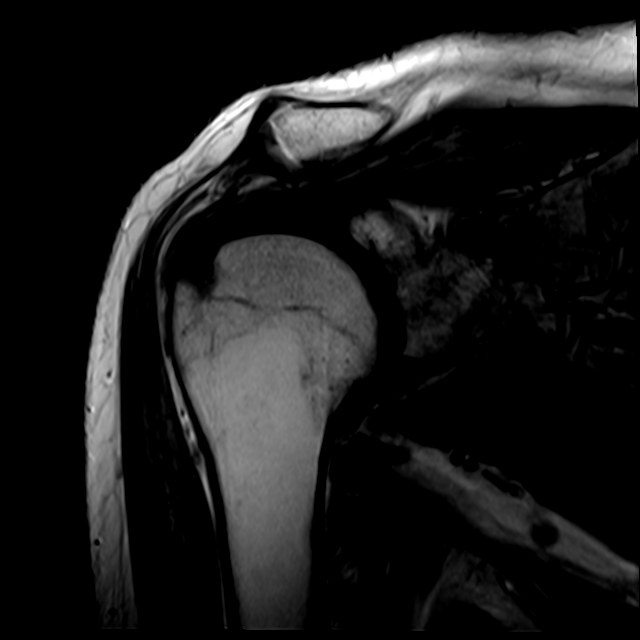
[im 17/21]
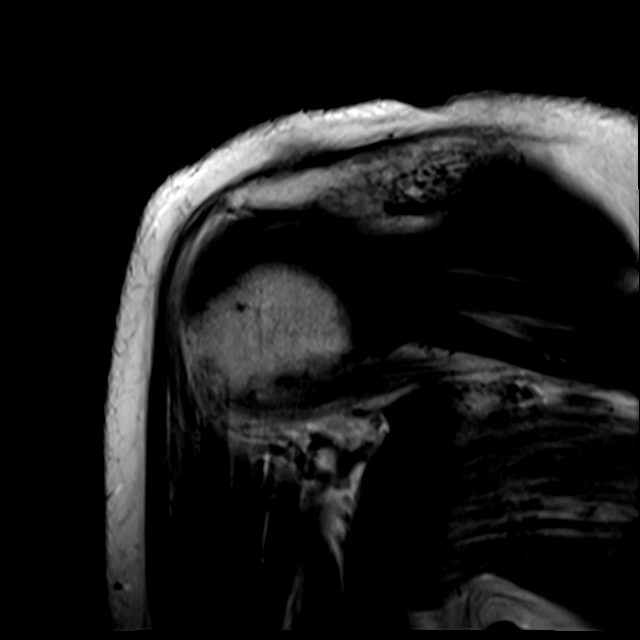
[im 21/21]
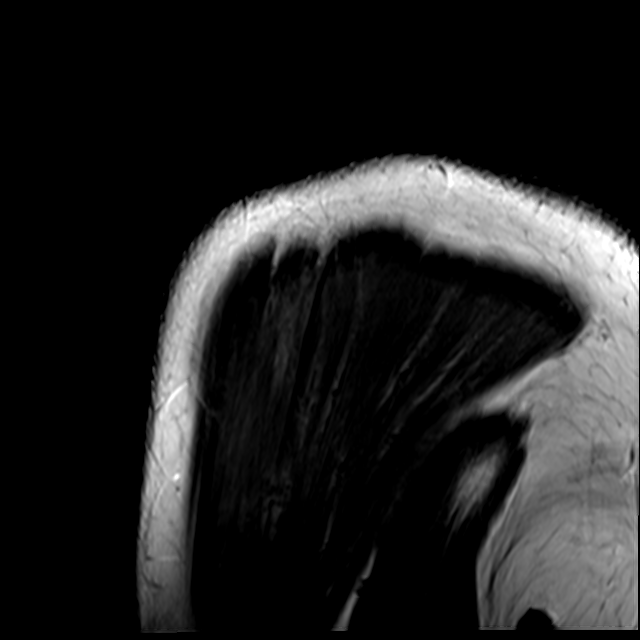

[Series 11: PD · sagittal · right · 4.0mm · 0.27mm/px · 5 of 21 slices shown (2 of 2)]
[im 1/21]
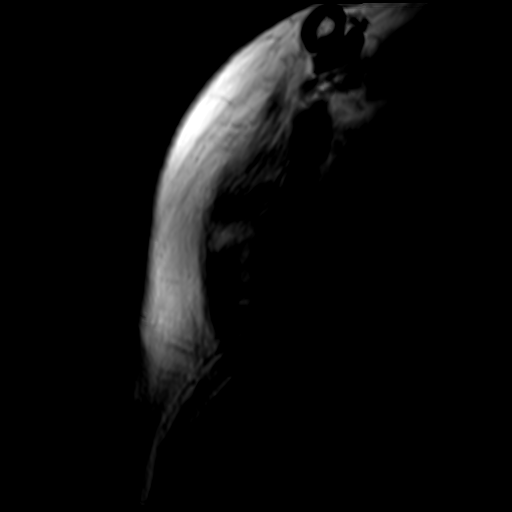
[im 5/21]
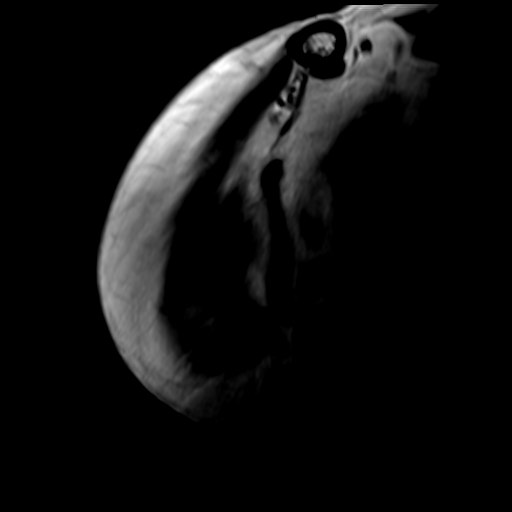
[im 9/21]
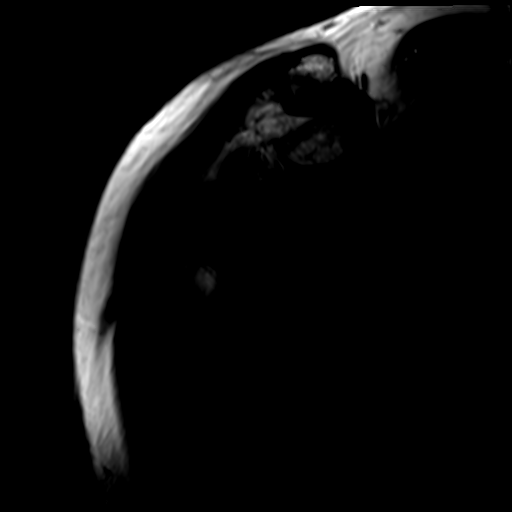
[im 13/21]
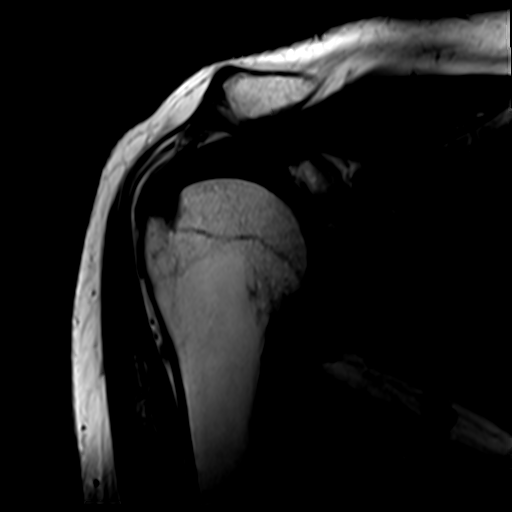
[im 21/21]
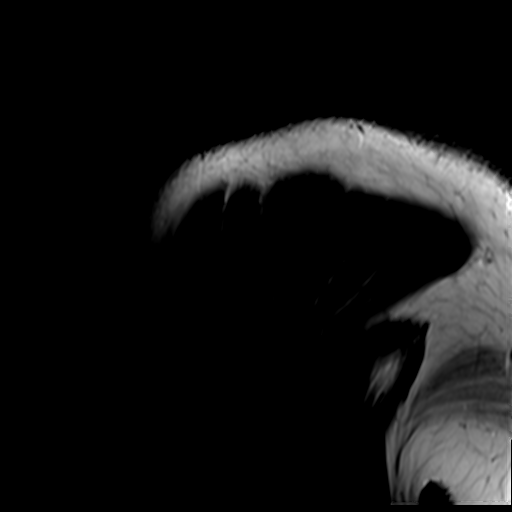

[17 of 40 positions shown; findings below may reference images not displayed]

FINDINGS: Rotator cuff: Supraspinatus tendinosis with low-grade insertional
tearing along the anterior to mid tendon. No full-thickness or
retracted component. Moderate subscapularis tendinosis.
Infraspinatus and teres minor tendons are intact.

Muscles: Advanced atrophy and fatty infiltration of the teres minor
muscle. There is also fatty infiltration of the teres major muscle.
Intramuscular edema within the anterior deltoid muscle, most
pronounced along its medial aspect.

Biceps long head:  Intact.

Acromioclavicular Joint: Mild-to-moderate arthropathy of the
acromioclavicular joint with small AC joint effusion. Trace
subacromial-subdeltoid bursal fluid.

Glenohumeral Joint: No joint effusion. No chondral defect.

Labrum: Grossly intact, but evaluation is limited by lack of
intraarticular fluid. No paralabral cyst.

Bones:  No marrow abnormality, fracture or dislocation.

Other: No solid or cystic mass lesions about the shoulder.
IMPRESSION: 1. Supraspinatus tendinosis with low-grade insertional tearing along
the anterior to mid tendon. No full-thickness or retracted
component.
2. Moderate subscapularis tendinosis.
3. Advanced atrophy and fatty infiltration of the teres minor muscle
suggesting chronic denervation. There is also fatty infiltration of
the teres major muscle.
4. Intramuscular edema within the anterior deltoid muscle. Findings
may be related to muscle strain versus acute denervation changes.
5. Mild-to-moderate acromioclavicular arthropathy with small AC
joint effusion.

## 2023-03-05 DIAGNOSIS — F411 Generalized anxiety disorder: Secondary | ICD-10-CM | POA: Diagnosis not present

## 2023-03-05 DIAGNOSIS — F17213 Nicotine dependence, cigarettes, with withdrawal: Secondary | ICD-10-CM | POA: Diagnosis not present

## 2023-03-05 DIAGNOSIS — E782 Mixed hyperlipidemia: Secondary | ICD-10-CM | POA: Diagnosis not present

## 2023-03-05 DIAGNOSIS — F17200 Nicotine dependence, unspecified, uncomplicated: Secondary | ICD-10-CM | POA: Diagnosis not present

## 2023-06-18 DIAGNOSIS — F17200 Nicotine dependence, unspecified, uncomplicated: Secondary | ICD-10-CM | POA: Diagnosis not present

## 2023-06-18 DIAGNOSIS — Z79899 Other long term (current) drug therapy: Secondary | ICD-10-CM | POA: Diagnosis not present

## 2023-06-18 DIAGNOSIS — F411 Generalized anxiety disorder: Secondary | ICD-10-CM | POA: Diagnosis not present

## 2023-06-18 DIAGNOSIS — I2510 Atherosclerotic heart disease of native coronary artery without angina pectoris: Secondary | ICD-10-CM | POA: Diagnosis not present

## 2023-06-18 DIAGNOSIS — Z0000 Encounter for general adult medical examination without abnormal findings: Secondary | ICD-10-CM | POA: Diagnosis not present

## 2023-06-18 DIAGNOSIS — E782 Mixed hyperlipidemia: Secondary | ICD-10-CM | POA: Diagnosis not present

## 2023-06-18 DIAGNOSIS — Z23 Encounter for immunization: Secondary | ICD-10-CM | POA: Diagnosis not present

## 2023-09-17 DIAGNOSIS — E782 Mixed hyperlipidemia: Secondary | ICD-10-CM | POA: Diagnosis not present
